# Patient Record
Sex: Female | Born: 1950 | Race: White | Hispanic: No | Marital: Married | Smoking: Former smoker
Health system: Southern US, Community
[De-identification: ages and names within clinical notes are randomized; demographics above are authoritative.]

## PROBLEM LIST (undated history)

## (undated) DIAGNOSIS — E785 Hyperlipidemia, unspecified: Secondary | ICD-10-CM

## (undated) DIAGNOSIS — F419 Anxiety disorder, unspecified: Secondary | ICD-10-CM

## (undated) DIAGNOSIS — I10 Essential (primary) hypertension: Secondary | ICD-10-CM

## (undated) DIAGNOSIS — R011 Cardiac murmur, unspecified: Secondary | ICD-10-CM

## (undated) DIAGNOSIS — K760 Fatty (change of) liver, not elsewhere classified: Secondary | ICD-10-CM

## (undated) HISTORY — DX: Fatty (change of) liver, not elsewhere classified: K76.0

## (undated) HISTORY — DX: Essential (primary) hypertension: I10

## (undated) HISTORY — DX: Anxiety disorder, unspecified: F41.9

## (undated) HISTORY — PX: SHOULDER ARTHROSCOPY: SHX128

## (undated) HISTORY — DX: Hyperlipidemia, unspecified: E78.5

## (undated) HISTORY — PX: ABDOMINAL HYSTERECTOMY: SHX81

## (undated) HISTORY — DX: Cardiac murmur, unspecified: R01.1

## (undated) HISTORY — PX: BACK SURGERY: SHX140

---

## 2017-02-27 LAB — HIV ANTIBODY (ROUTINE TESTING W REFLEX): HIV 1&2 Ab, 4th Generation: NONREACTIVE

## 2017-02-28 LAB — HM HIV SCREENING LAB: HM HIV Screening: NEGATIVE

## 2017-02-28 LAB — HM HEPATITIS C SCREENING LAB: HM Hepatitis Screen: NEGATIVE

## 2017-05-16 LAB — HM DEXA SCAN

## 2017-09-16 LAB — HM MAMMOGRAPHY

## 2018-02-02 LAB — HEMOGLOBIN A1C: Hemoglobin A1C: 5.3

## 2018-02-02 LAB — TSH: TSH: 1.56 (ref <=5.90)

## 2019-07-29 LAB — COMPREHENSIVE METABOLIC PANEL
Albumin: 4.2 (ref 3.5–5.0)
Calcium: 9.6 (ref 8.7–10.7)
GFR calc Af Amer: 52
GFR calc non Af Amer: 45
Globulin: 2.8

## 2019-07-29 LAB — CBC: RBC: 4.43 (ref 3.87–5.11)

## 2019-07-29 LAB — HEPATIC FUNCTION PANEL
ALT: 26 (ref 7–35)
AST: 27 (ref 13–35)
Alkaline Phosphatase: 82 (ref 25–125)
Bilirubin, Total: 0.5

## 2019-07-29 LAB — BASIC METABOLIC PANEL
BUN: 17 (ref 4–21)
CO2: 30 — AB (ref 13–22)
Chloride: 102 (ref 99–108)
Creatinine: 1.2 — AB (ref 0.5–1.1)
Glucose: 131
Potassium: 4.1 (ref 3.4–5.3)
Sodium: 141 (ref 137–147)

## 2019-07-29 LAB — CBC AND DIFFERENTIAL
HCT: 41 (ref 36–46)
Hemoglobin: 13.5 (ref 12.0–16.0)
Platelets: 297 (ref 150–399)
WBC: 5.1

## 2019-07-29 LAB — VITAMIN D 25 HYDROXY (VIT D DEFICIENCY, FRACTURES): Vit D, 25-Hydroxy: 42

## 2019-07-30 LAB — LIPID PANEL
Cholesterol: 144 (ref 0–200)
HDL: 40 (ref 35–70)
LDL Cholesterol: 79
Triglycerides: 82 (ref 40–160)

## 2019-12-25 ENCOUNTER — Telehealth: Payer: Self-pay

## 2019-12-25 NOTE — Telephone Encounter (Signed)
Copied from CRM [id number]. Topic: General - Other >> Dec 25, 2019  3:38 PM Marylen Ponto wrote: Reason for CRM: Pt has a new pt appt scheduled on 01/24/20 with Dr. B and pt requests Rx refill of pantoprazole 40 mg. If approved pt [facility] [place] Cb# [phone]

## 2019-12-26 NOTE — Telephone Encounter (Signed)
Pt called back in to follow up. Made pt aware of message per provider.

## 2019-12-26 NOTE — Telephone Encounter (Signed)
Cannot Rx medications until she establishes.  She can buy Nexium or Omeprazole OTC to take in place of protonix in the meantime.

## 2019-12-26 NOTE — Telephone Encounter (Signed)
NA, voicemail is full. 

## 2020-01-24 ENCOUNTER — Ambulatory Visit (INDEPENDENT_AMBULATORY_CARE_PROVIDER_SITE_OTHER): Payer: Medicare HMO | Admitting: Family Medicine

## 2020-01-24 ENCOUNTER — Other Ambulatory Visit: Payer: Self-pay

## 2020-01-24 ENCOUNTER — Encounter: Payer: Self-pay | Admitting: Family Medicine

## 2020-01-24 VITALS — BP 125/68 | HR 80 | Temp 97.9°F | Ht 62.0 in | Wt 203.0 lb

## 2020-01-24 DIAGNOSIS — I10 Essential (primary) hypertension: Secondary | ICD-10-CM | POA: Diagnosis not present

## 2020-01-24 DIAGNOSIS — K760 Fatty (change of) liver, not elsewhere classified: Secondary | ICD-10-CM

## 2020-01-24 DIAGNOSIS — K219 Gastro-esophageal reflux disease without esophagitis: Secondary | ICD-10-CM

## 2020-01-24 DIAGNOSIS — F411 Generalized anxiety disorder: Secondary | ICD-10-CM

## 2020-01-24 DIAGNOSIS — F419 Anxiety disorder, unspecified: Secondary | ICD-10-CM | POA: Insufficient documentation

## 2020-01-24 DIAGNOSIS — R8290 Unspecified abnormal findings in urine: Secondary | ICD-10-CM

## 2020-01-24 DIAGNOSIS — E782 Mixed hyperlipidemia: Secondary | ICD-10-CM | POA: Diagnosis not present

## 2020-01-24 DIAGNOSIS — E785 Hyperlipidemia, unspecified: Secondary | ICD-10-CM | POA: Insufficient documentation

## 2020-01-24 DIAGNOSIS — Z1159 Encounter for screening for other viral diseases: Secondary | ICD-10-CM

## 2020-01-24 LAB — POCT URINALYSIS DIPSTICK
Bilirubin, UA: NEGATIVE
Blood, UA: NEGATIVE
Glucose, UA: NEGATIVE
Ketones, UA: NEGATIVE
Leukocytes, UA: NEGATIVE
Nitrite, UA: NEGATIVE
Odor: POSITIVE
Protein, UA: NEGATIVE
Spec Grav, UA: 1.015 (ref 1.010–1.025)
Urobilinogen, UA: 0.2 E.U./dL
pH, UA: 6 (ref 5.0–8.0)

## 2020-01-24 MED ORDER — VENLAFAXINE HCL 37.5 MG PO TABS: 37.5000 mg | ORAL_TABLET | Freq: Two times a day (BID) | ORAL | 1 refills | Status: DC

## 2020-01-24 MED ORDER — HYDROCHLOROTHIAZIDE 12.5 MG PO CAPS: 12.5000 mg | ORAL_CAPSULE | Freq: Every day | ORAL | 1 refills | Status: DC

## 2020-01-24 MED ORDER — PANTOPRAZOLE SODIUM 40 MG PO TBEC: 40.0000 mg | DELAYED_RELEASE_TABLET | Freq: Every day | ORAL | 1 refills | Status: DC

## 2020-01-24 MED ORDER — AMLODIPINE BESYLATE 5 MG PO TABS: 5.0000 mg | ORAL_TABLET | Freq: Every day | ORAL | 1 refills | Status: DC

## 2020-01-24 NOTE — Assessment & Plan Note (Signed)
Patient reported previous diagnosis Discussed diet and exercise changes Weight loss would help Recheck LFTs

## 2020-01-24 NOTE — Assessment & Plan Note (Signed)
Previous control unclear We will request records from previous PCP Does not seem to be on a statin at this time, but will call back if she finds one at home Recheck CMP and FLP Calculate ASCVD risk to determine need for primary prevention with medication

## 2020-01-24 NOTE — Assessment & Plan Note (Signed)
Chronic and well-controlled Continue Effexor at current dose Encourage therapy No depressive symptoms or SI Follow-up in 6 months at CPE

## 2020-01-24 NOTE — Assessment & Plan Note (Signed)
Discussed importance of healthy weight management Discussed diet and exercise BMI 37 and associated with HTN, fatty liver disease, HLD, GERD Check labs today Referral to healthy weight and wellness clinic for medical management

## 2020-01-24 NOTE — Assessment & Plan Note (Signed)
Well controlled Continue current medications Recheck metabolic panel F/u in 6 months  

## 2020-01-24 NOTE — Assessment & Plan Note (Signed)
Chronic and well-controlled Continue PPI With dietary changes, consider discontinuing PPI

## 2020-01-24 NOTE — Progress Notes (Signed)
New patient visit   Patient: Candace Faulkner   DOB: [birth year]   69 y.o. Female  MRN: [id number] Visit Date: 01/24/2020  Today's healthcare provider: Shirlee Latch, MD   Chief Complaint  Patient presents with  . Establish Care   Subjective    Candace Faulkner is a 69 y.o. female who presents today as a new patient to establish care.  HPI  Patient has some concerns about trying to lose weight.  She reports having elevated cholesterol and fatty liver.  She states she is on medicine for the cholesterol but does not have it with her.  She has cut down on portion sizes and stopped drinking sodas. She tried keto diet and gained weight. No exercise currently, limited by OA pain. She has personal goal weight of 150lbs, the weight she was prior to birth of her last few children.  She just moved to the area from [place].    Previously diagnosed with anxiety. She is taking Effexor with good compliance and good control of symptoms.  Denies any depressive symptoms, SI, AVH.  Taking pantoprazole for GERD. Well controlled.  Insomnia - taking doxepin 25mg  qhs with good control of symptoms.   Past Medical History:  Diagnosis Date  . Anxiety   . Fatty liver   . Heart murmur   . Hyperlipidemia   . Hypertension    Past Surgical History:  Procedure Laterality Date  . ABDOMINAL HYSTERECTOMY    . BACK SURGERY     L4-L5 disc surgery  . SHOULDER ARTHROSCOPY Right    ligament repair   Family Status  Relation Name Status  . Brother  Deceased  . Neg Hx  (Not Specified)   Family History  Problem Relation Age of Onset  . Diabetes Brother   . Hypertension Neg Hx   . Breast cancer Neg Hx   . Colon cancer Neg Hx    Social History   Socioeconomic History  . Marital status: Married    Spouse name: Not on file  . Number of children: 7  . Years of education: 63  . Highest education level: Not on file  Occupational History  . Occupation: retired  Tobacco Use  . Smoking status: Former Smoker     Packs/day: 0.50    Years: 20.00    Pack years: 10.00    Types: Cigarettes    Quit date: 02/14/1989    Years since quitting: 30.9  . Smokeless tobacco: Never Used  Vaping Use  . Vaping Use: Every day  Substance and Sexual Activity  . Alcohol use: Not Currently  . Drug use: Never  . Sexual activity: Yes    Partners: Male    Birth control/protection: Post-menopausal, Surgical  Other Topics Concern  . Not on file  Social History Narrative  . Not on file   Social Determinants of Health   Financial Resource Strain: Not on file  Food Insecurity: Not on file  Transportation Needs: Not on file  Physical Activity: Not on file  Stress: Not on file  Social Connections: Not on file   Outpatient Medications Prior to Visit  Medication Sig  . [DISCONTINUED] amLODipine (NORVASC) 5 MG tablet Take 5 mg by mouth daily.  04/14/1989 doxepin (SINEQUAN) 25 MG capsule Take 25 mg by mouth.  . [DISCONTINUED] hydrochlorothiazide (MICROZIDE) 12.5 MG capsule Take 12.5 mg by mouth daily.  . [DISCONTINUED] pantoprazole (PROTONIX) 40 MG tablet Take 40 mg by mouth daily.  . [DISCONTINUED] venlafaxine (EFFEXOR) 37.5 MG tablet Take 37.5  mg by mouth 2 (two) times daily.   No facility-administered medications prior to visit.   Allergies  Allergen Reactions  . Latex     Immunization History  Administered Date(s) Administered  . Moderna SARS-COVID-2 Vaccination 09/13/2019, 10/11/2019    Health Maintenance  Topic Date Due  . Hepatitis C Screening  Never done  . TETANUS/TDAP  Never done  . MAMMOGRAM  Never done  . COLONOSCOPY  Never done  . DEXA SCAN  Never done  . PNA vac Low Risk Adult (1 of 2 - PCV13) Never done  . INFLUENZA VACCINE  05/14/2020 (Originally 09/15/2019)  . COVID-19 Vaccine (3 - Booster for Moderna series) 04/12/2020    Patient Care Team: Erasmo Downer, MD as PCP - General (Family Medicine)  Review of Systems  Constitutional: Negative.   HENT: Negative.   Eyes: Negative.    Respiratory: Negative.   Cardiovascular: Negative.   Gastrointestinal: Positive for abdominal distention.  Endocrine: Negative.   Genitourinary: Negative.   Musculoskeletal: Positive for back pain.  Skin: Negative.   Allergic/Immunologic: Negative.   Neurological: Negative.   Hematological: Negative.   Psychiatric/Behavioral: Negative.       Objective    BP 125/68 (BP Location: Right Arm, Patient Position: Sitting, Cuff Size: Normal)   Pulse 80   Temp 97.9 F (36.6 C) (Oral)   Ht 5\' 2"  (1.575 m)   Wt 203 lb (92.1 kg)   SpO2 98%   BMI 37.13 kg/m  Physical Exam Vitals reviewed.  Constitutional:      General: She is not in acute distress.    Appearance: Normal appearance. She is well-developed. She is not diaphoretic.  HENT:     Head: Normocephalic and atraumatic.  Eyes:     General: No scleral icterus.    Conjunctiva/sclera: Conjunctivae normal.  Neck:     Thyroid: No thyromegaly.  Cardiovascular:     Rate and Rhythm: Normal rate and regular rhythm.     Pulses: Normal pulses.     Heart sounds: Normal heart sounds. No murmur heard.   Pulmonary:     Effort: Pulmonary effort is normal. No respiratory distress.     Breath sounds: Normal breath sounds. No wheezing, rhonchi or rales.  Musculoskeletal:     Cervical back: Neck supple.     Right lower leg: No edema.     Left lower leg: No edema.  Lymphadenopathy:     Cervical: No cervical adenopathy.  Skin:    General: Skin is warm and dry.     Findings: No rash.  Neurological:     Mental Status: She is alert and oriented to person, place, and time. Mental status is at baseline.  Psychiatric:        Mood and Affect: Mood normal.        Behavior: Behavior normal.      Depression Screen PHQ 2/9 Scores 01/24/2020  PHQ - 2 Score 0  PHQ- 9 Score 3   Results for orders placed or performed in visit on 01/24/20  POCT urinalysis dipstick  Result Value Ref Range   Color, UA yellow    Clarity, UA clear     Glucose, UA Negative Negative   Bilirubin, UA neg    Ketones, UA neg    Spec Grav, UA 1.015 1.010 - 1.025   Blood, UA neg    pH, UA 6.0 5.0 - 8.0   Protein, UA Negative Negative   Urobilinogen, UA 0.2 0.2 or 1.0 E.U./dL   Nitrite,  UA neg    Leukocytes, UA Negative Negative   Appearance     Odor pos     Assessment & Plan      Problem List Items Addressed This Visit      Cardiovascular and Mediastinum   Hypertension - Primary    Well controlled Continue current medications Recheck metabolic panel F/u in 6 months       Relevant Medications   hydrochlorothiazide (MICROZIDE) 12.5 MG capsule   amLODipine (NORVASC) 5 MG tablet   Other Relevant Orders   Comprehensive metabolic panel     Digestive   Fatty liver    Patient reported previous diagnosis Discussed diet and exercise changes Weight loss would help Recheck LFTs      Relevant Orders   Comprehensive metabolic panel   Gastroesophageal reflux disease without esophagitis    Chronic and well-controlled Continue PPI With dietary changes, consider discontinuing PPI      Relevant Medications   pantoprazole (PROTONIX) 40 MG tablet   Other Relevant Orders   CBC     Other   Anxiety    Chronic and well-controlled Continue Effexor at current dose Encourage therapy No depressive symptoms or SI Follow-up in 6 months at CPE      Relevant Medications   doxepin (SINEQUAN) 25 MG capsule   venlafaxine (EFFEXOR) 37.5 MG tablet   Hyperlipidemia    Previous control unclear We will request records from previous PCP Does not seem to be on a statin at this time, but will call back if she finds one at home Recheck CMP and FLP Calculate ASCVD risk to determine need for primary prevention with medication      Relevant Medications   hydrochlorothiazide (MICROZIDE) 12.5 MG capsule   amLODipine (NORVASC) 5 MG tablet   Other Relevant Orders   Lipid panel   Comprehensive metabolic panel   Morbid obesity (HCC)     Discussed importance of healthy weight management Discussed diet and exercise BMI 37 and associated with HTN, fatty liver disease, HLD, GERD Check labs today Referral to healthy weight and wellness clinic for medical management      Relevant Orders   Lipid panel   Comprehensive metabolic panel   TSH   Amb Ref to Medical Weight Management   CBC    Other Visit Diagnoses    Need for hepatitis C screening test       Relevant Orders   Hepatitis C Antibody   Foul smelling urine       Relevant Orders   POCT urinalysis dipstick (Completed)      ROI sent to previous PCP office for her last health maintenance, labs, etc.   Return in about 6 months (around 07/24/2020) for CPE, AWV.      Total time spent on today's visit was greater than 45 minutes, including both face-to-face time and nonface-to-face time personally spent on review of chart (labs and imaging), discussing labs and goals, discussing further work-up, treatment options, referrals to specialist if needed, reviewing outside records of pertinent, answering patient's questions, and coordinating care.    I, Shirlee Latch, MD, have reviewed all documentation for this visit. The documentation on 01/24/20 for the exam, diagnosis, procedures, and orders are all accurate and complete.   Candace Faulkner, Candace Schlein, MD, MPH [facility]

## 2020-02-20 ENCOUNTER — Telehealth: Payer: Self-pay | Admitting: Family Medicine

## 2020-02-20 NOTE — Telephone Encounter (Signed)
Patient need PA on Doxepin.

## 2020-02-20 NOTE — Telephone Encounter (Signed)
Medication Refill - Medication: Doxepin   Has the patient contacted their pharmacy? Yes.   Pt states that the pharmacy told her they needed authorization from PCP. Please advise.  (Agent: If no, request that the patient contact the pharmacy for the refill.) (Agent: If yes, when and what did the pharmacy advise?)  Preferred Pharmacy (with phone number or street name):  [id number] [place], [place] - [address]  [address] [place] [address]  Phone: [phone] Fax: [phone]  Hours: Not open 24 hours     Agent: Please be advised that RX refills may take up to 3 business days. We ask that you follow-up with your pharmacy.

## 2020-02-25 ENCOUNTER — Other Ambulatory Visit: Payer: Self-pay

## 2020-02-25 MED ORDER — DOXEPIN HCL 25 MG PO CAPS: 25.0000 mg | ORAL_CAPSULE | Freq: Every day | ORAL | 3 refills | Status: DC

## 2020-02-25 NOTE — Telephone Encounter (Signed)
PA is not needed for this medication, patient is needing refill on medication.

## 2020-03-24 ENCOUNTER — Telehealth: Payer: Self-pay | Admitting: Family Medicine

## 2020-03-24 NOTE — Telephone Encounter (Signed)
Copied from CRM [id number]. Topic: Medicare AWV >> Mar 24, 2020 12:22 PM Claudette Laws R wrote: Reason for CRM:  Left message for patient to call back and schedule Medicare Annual Wellness Visit (AWV) in office.   If not able to come in the office, please offer to do virtually or by telephone.   No hx of AWV - AWV-I eligible as of 01/14/2018  Please schedule at anytime with [facility] Health Advisor.   40 minute appointment  Any questions, please contact me at [phone]

## 2020-03-31 ENCOUNTER — Ambulatory Visit: Payer: Self-pay | Admitting: *Deleted

## 2020-03-31 NOTE — Telephone Encounter (Signed)
Pt calling stating that the she is needing to have a medication refilled. She states that it is a BP medication and she is not sure of the name, but that it starts with an "A". Pt states that she has been out x 2-3 days. Please advise.   Call to patient - patient is requesting RF of atorvastatin 10 mg- she states she has been taking for 2 years. Patient advised will send request for PCP review and office wil call her back with any questions. Reason for Disposition . [1] Prescription refill request for NON-ESSENTIAL medicine (i.e., no harm to patient if med not taken) AND [2] triager unable to refill per department policy  Answer Assessment - Initial Assessment Questions 1. DRUG NAME: "What medicine do you need to have refilled?"     atorvastatin 10 mg- patient reports she has been taking this medication for high cholesterol for 2 years now. 2. REFILLS REMAINING: "How many refills are remaining?" (Note: The label on the medicine or pill bottle will show how many refills are remaining. If there are no refills remaining, then a renewal may be needed.)     none 3. EXPIRATION DATE: "What is the expiration date?" (Note: The label states when the prescription will expire, and thus can no longer be refilled.)     Out of medication- plans labs today 4. PRESCRIBING HCP: "Who prescribed it?" Reason: If prescribed by specialist, call should be referred to that group.    Historical provider 5. SYMPTOMS: "Do you have any symptoms?"     no 6. PREGNANCY: "Is there any chance that you are pregnant?" "When was your last menstrual period?"     n/a  Protocols used: MEDICATION REFILL AND RENEWAL CALL-A-AH

## 2020-04-01 NOTE — Telephone Encounter (Signed)
LMTCB

## 2020-04-01 NOTE — Telephone Encounter (Signed)
Per triage note, patient originally requested refill on a BP medication.  RN has listed medication as Atorvastatin.  Patient is not on Atorvastatin and it is not in her medication history.    Need to clarify if patient needs Atorvastatin or if she thinks she needs refill on a blood pressure medication (which may be her Amlodipine-since she said it began with an A)

## 2020-05-26 ENCOUNTER — Ambulatory Visit: Payer: Self-pay | Admitting: *Deleted

## 2020-05-26 DIAGNOSIS — L309 Dermatitis, unspecified: Secondary | ICD-10-CM | POA: Diagnosis not present

## 2020-05-26 NOTE — Telephone Encounter (Signed)
Pt called stating her hands are swollen, red, and itchy;  the pt says her hands are has a throbbing and have a burning sensation; the itching and redness started on 05/25/20 and have worsened; she noticed the swelling on 05/25/20; there is a ring on her left ring from that she can not remove; recommendations made per nurse triage; she verbalized understanding and will be evaluated at Urgent Care; the pt is seen by Dr Beryle Flock, [facility]; will route to office for notification.   Reason for Disposition . SEVERE hand swelling (e.g., swelling of entire hand and up into forearm)  Answer Assessment - Initial Assessment Questions 1. ONSET: "When did the swelling start?" (e.g., minutes, hours, days)     05/24/20 2. LOCATION: "What part of the hand is swollen?"  "Are both hands swollen or just one hand?"     Tops ob both hands and all finers 3. SEVERITY: "How bad is the swelling?" (e.g., localized; mild, moderate, severe)   - BALL OR LUMP: small ball or lump   - LOCALIZED: puffy or swollen area or patch of skin   - JOINT SWELLING: swelling of a joint   - MILD: puffiness or mild swelling of fingers or hand   - MODERATE: fingers and hand are swollen   - SEVERE: swelling of entire hand and up into forearm    moderate 4. REDNESS: "Does the swelling look red or infected?"     reddened 5. PAIN: "Is the swelling painful to touch?" If Yes, ask: "How painful is it?"   (Scale 1-10; mild, moderate or severe)    Throbbing pain 6. FEVER: "Do you have a fever?" If Yes, ask: "What is it, how was it measured, and when did it start?"      7. CAUSE: "What do you think is causing the hand swelling?" (e.g., heat, insect bite, pregnancy, recent injury) unsure 8. MEDICAL HISTORY: "Do you have a history of heart failure, kidney disease, liver failure, or cancer?"      9. RECURRENT SYMPTOM: "Have you had hand swelling before?" If Yes, ask: "When was the last time?" "What happened that time?"    10. OTHER  SYMPTOMS: "Do you have any other symptoms?" (e.g., blurred vision, difficulty breathing, headache)     Hands itching; unable to remove ring from left ring finger 11. PREGNANCY: "Is there any chance you are pregnant?" "When was your last menstrual period?"  Protocols used: HAND [facility]

## 2020-06-28 ENCOUNTER — Other Ambulatory Visit: Payer: Self-pay | Admitting: Family Medicine

## 2020-06-28 NOTE — Telephone Encounter (Signed)
Requested Prescriptions  Pending Prescriptions Disp Refills  . doxepin (SINEQUAN) 25 MG capsule [Pharmacy Med Name: DOXEPIN 25 MG CAPSULE] 30 capsule 0    Sig: TAKE 1 CAPSULE BY MOUTH AT BEDTIME.     Psychiatry:  Antidepressants - Heterocyclics (TCAs) Passed - 06/28/2020  9:08 AM      Passed - Valid encounter within last 6 months    Recent Outpatient Visits          5 months ago Primary hypertension   [facility] Bacigalupo, Marzella Schlein, MD      Future Appointments            In 4 weeks Bacigalupo, Marzella Schlein, MD [facility], [facility]

## 2020-07-20 ENCOUNTER — Other Ambulatory Visit: Payer: Self-pay | Admitting: Family Medicine

## 2020-07-27 ENCOUNTER — Encounter: Payer: Self-pay | Admitting: Family Medicine

## 2020-07-28 ENCOUNTER — Other Ambulatory Visit: Payer: Self-pay | Admitting: Family Medicine

## 2020-07-28 NOTE — Telephone Encounter (Signed)
Requested medications are due for refill today yes  Requested medications are on the active medication list yes  Last refill 5/15  Last visit Was scheduled 6/13 but canceled by provider  Future visit scheduled 9/27  Notes to clinic Was already given a 30 day supply, provider canceled appt, next appt 11/10/20.

## 2020-07-30 ENCOUNTER — Other Ambulatory Visit: Payer: Self-pay | Admitting: Family Medicine

## 2020-07-30 NOTE — Telephone Encounter (Signed)
Requested Prescriptions  Pending Prescriptions Disp Refills  . pantoprazole (PROTONIX) 40 MG tablet [Pharmacy Med Name: PANTOPRAZOLE SOD DR 40 MG TAB] 90 tablet 1    Sig: TAKE 1 TABLET BY MOUTH EVERY DAY     Gastroenterology: Proton Pump Inhibitors Passed - 07/30/2020  1:32 AM      Passed - Valid encounter within last 12 months    Recent Outpatient Visits          6 months ago Primary hypertension   [facility] Bacigalupo, Marzella Schlein, MD      Future Appointments            In 3 months Bacigalupo, Marzella Schlein, MD [facility], [facility]   In 7 months Bacigalupo, Marzella Schlein, MD [facility], [facility]

## 2020-08-10 ENCOUNTER — Other Ambulatory Visit: Payer: Self-pay | Admitting: Family Medicine

## 2020-08-11 NOTE — Telephone Encounter (Signed)
Notes to clinic:  Next appt is scheduled for 11/10/2020 Review for enough medication until that time Appt on 6/16 was canceled by provider    Requested Prescriptions  Pending Prescriptions Disp Refills   amLODipine (NORVASC) 5 MG tablet 90 tablet 1    Sig: Take 1 tablet (5 mg total) by mouth daily.      Cardiovascular:  Calcium Channel Blockers Failed - 08/11/2020  3:09 PM      Failed - Valid encounter within last 6 months    Recent Outpatient Visits           6 months ago Primary hypertension   [facility] Bacigalupo, Marzella Schlein, MD       Future Appointments             In 3 months Bacigalupo, Marzella Schlein, MD [facility], [facility]   In 6 months Bacigalupo, Marzella Schlein, MD [facility], [facility]             Passed - Last BP in normal range    BP Readings from Last 1 Encounters:  01/24/20 125/68           Refused Prescriptions Disp Refills   hydrochlorothiazide (MICROZIDE) 12.5 MG capsule [Pharmacy Med Name: HYDROCHLOROTHIAZIDE 12.5 MG CP] 90 capsule 0    Sig: TAKE 1 CAPSULE BY MOUTH EVERY DAY      Cardiovascular: Diuretics - Thiazide Failed - 08/11/2020  3:09 PM      Failed - Ca in normal range and within 360 days    Calcium  Date Value Ref Range Status  07/29/2019 9.6 8.7 - 10.7 Final          Failed - Cr in normal range and within 360 days    Creatinine  Date Value Ref Range Status  07/29/2019 1.2 (A) 0.5 - 1.1 Final          Failed - K in normal range and within 360 days    Potassium  Date Value Ref Range Status  07/29/2019 4.1 3.4 - 5.3 Final          Failed - Na in normal range and within 360 days    Sodium  Date Value Ref Range Status  07/29/2019 141 137 - 147 Final          Failed - Valid encounter within last 6 months    Recent Outpatient Visits           6 months ago Primary hypertension   [facility] Bacigalupo, Marzella Schlein, MD       Future Appointments             In 3 months  Bacigalupo, Marzella Schlein, MD [facility], [facility]   In 6 months Bacigalupo, Marzella Schlein, MD [facility], [facility]             Passed - Last BP in normal range    BP Readings from Last 1 Encounters:  01/24/20 125/68            venlafaxine (EFFEXOR) 37.5 MG tablet [Pharmacy Med Name: VENLAFAXINE HCL 37.5 MG TABLET] 180 tablet 0    Sig: TAKE 1 TABLET BY MOUTH TWICE A DAY      Psychiatry: Antidepressants - SNRI - desvenlafaxine & venlafaxine Failed - 08/11/2020  3:09 PM      Failed - LDL in normal range and within 360 days    LDL Cholesterol  Date Value Ref Range Status  07/29/2019 79  Final          Failed - Total Cholesterol in normal range and within 360 days    Cholesterol  Date Value Ref Range Status  07/29/2019 144 0 - 200 Final          Failed - Triglycerides in normal range and within 360 days    Triglycerides  Date Value Ref Range Status  07/29/2019 82 40 - 160 Final          Failed - Valid encounter within last 6 months    Recent Outpatient Visits           6 months ago Primary hypertension   [facility] Bacigalupo, Marzella Schlein, MD       Future Appointments             In 3 months Bacigalupo, Marzella Schlein, MD [facility], [facility]   In 6 months Bacigalupo, Marzella Schlein, MD [facility], [facility]             Passed - Last BP in normal range    BP Readings from Last 1 Encounters:  01/24/20 125/68

## 2020-08-11 NOTE — Telephone Encounter (Signed)
Pt called and stated that the pharmacy advised her all her medications were due for refill/ Pt was advised and understood that request was too early but she stated the only medication she asked the pharmacy to send request for was amLODipine (NORVASC) 5 MG tablet [facility] send to [id number] - [place], [place] - [address]  [address], [place] [address]  Phone:  [phone]  Fax:  [phone]

## 2020-08-12 MED ORDER — AMLODIPINE BESYLATE 5 MG PO TABS: 5.0000 mg | ORAL_TABLET | Freq: Every day | ORAL | 0 refills | Status: DC

## 2020-09-14 ENCOUNTER — Other Ambulatory Visit: Payer: Self-pay | Admitting: Family Medicine

## 2020-09-14 NOTE — Telephone Encounter (Signed)
Requested Prescriptions  Pending Prescriptions Disp Refills  . hydrochlorothiazide (MICROZIDE) 12.5 MG capsule [Pharmacy Med Name: HYDROCHLOROTHIAZIDE 12.5 MG CP] 90 capsule 0    Sig: TAKE 1 CAPSULE BY MOUTH EVERY DAY     Cardiovascular: Diuretics - Thiazide Failed - 09/14/2020  3:24 PM      Failed - Ca in normal range and within 360 days    Calcium  Date Value Ref Range Status  07/29/2019 9.6 8.7 - 10.7 Final         Failed - Cr in normal range and within 360 days    Creatinine  Date Value Ref Range Status  07/29/2019 1.2 (A) 0.5 - 1.1 Final         Failed - K in normal range and within 360 days    Potassium  Date Value Ref Range Status  07/29/2019 4.1 3.4 - 5.3 Final         Failed - Na in normal range and within 360 days    Sodium  Date Value Ref Range Status  07/29/2019 141 137 - 147 Final         Failed - Valid encounter within last 6 months    Recent Outpatient Visits          7 months ago Primary hypertension   [facility] Bacigalupo, Marzella Schlein, MD      Future Appointments            In 3 months Bacigalupo, Marzella Schlein, MD [facility]   In 5 months Bacigalupo, Marzella Schlein, MD [facility], [facility]           Passed - Last BP in normal range    BP Readings from Last 1 Encounters:  01/24/20 125/68         . venlafaxine (EFFEXOR) 37.5 MG tablet [Pharmacy Med Name: VENLAFAXINE HCL 37.5 MG TABLET] 180 tablet 0    Sig: TAKE 1 TABLET BY MOUTH TWICE A DAY     Psychiatry: Antidepressants - SNRI - desvenlafaxine & venlafaxine Failed - 09/14/2020  3:24 PM      Failed - LDL in normal range and within 360 days    LDL Cholesterol  Date Value Ref Range Status  07/29/2019 79  Final         Failed - Total Cholesterol in normal range and within 360 days    Cholesterol  Date Value Ref Range Status  07/29/2019 144 0 - 200 Final         Failed - Triglycerides in normal range and within 360 days    Triglycerides  Date Value Ref  Range Status  07/29/2019 82 40 - 160 Final         Failed - Valid encounter within last 6 months    Recent Outpatient Visits          7 months ago Primary hypertension    [facility] Bacigalupo, Marzella Schlein, MD      Future Appointments            In 3 months Bacigalupo, Marzella Schlein, MD [facility], [facility]   In 5 months Bacigalupo, Marzella Schlein, MD [facility], [facility]           Passed - Last BP in normal range    BP Readings from Last 1 Encounters:  01/24/20 125/68

## 2020-09-14 NOTE — Telephone Encounter (Signed)
Requested Prescriptions  Pending Prescriptions Disp Refills  . doxepin (SINEQUAN) 25 MG capsule [Pharmacy Med Name: DOXEPIN 25 MG CAPSULE] 30 capsule 0    Sig: TAKE 1 CAPSULE BY MOUTH EVERYDAY AT BEDTIME     Psychiatry:  Antidepressants - Heterocyclics (TCAs) Failed - 09/14/2020  3:24 PM      Failed - Valid encounter within last 6 months    Recent Outpatient Visits          7 months ago Primary hypertension    [facility] Bacigalupo, Marzella Schlein, MD      Future Appointments            In 3 months Bacigalupo, Marzella Schlein, MD [facility], [facility]   In 5 months Bacigalupo, Marzella Schlein, MD [facility], [facility]

## 2020-09-28 ENCOUNTER — Ambulatory Visit (INDEPENDENT_AMBULATORY_CARE_PROVIDER_SITE_OTHER): Payer: Self-pay | Admitting: Family Medicine

## 2020-09-28 ENCOUNTER — Other Ambulatory Visit: Payer: Self-pay

## 2020-09-28 ENCOUNTER — Encounter: Payer: Self-pay | Admitting: Family Medicine

## 2020-09-28 DIAGNOSIS — I10 Essential (primary) hypertension: Secondary | ICD-10-CM

## 2020-09-28 DIAGNOSIS — R600 Localized edema: Secondary | ICD-10-CM | POA: Insufficient documentation

## 2020-09-28 MED ORDER — HYDROCHLOROTHIAZIDE 25 MG PO TABS: 25.0000 mg | ORAL_TABLET | Freq: Every day | ORAL | 3 refills | Status: DC

## 2020-09-28 NOTE — Assessment & Plan Note (Signed)
Well controlled on home readings Will stop amlodipine as below for edema and increase HCTZ to compensate F/u in 1 m
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
## 2020-09-28 NOTE — Assessment & Plan Note (Signed)
Discussed importance of healthy weight management Discussed diet and exercise  

## 2020-09-28 NOTE — Assessment & Plan Note (Signed)
New problem x54m Stable without signs of DVT Suspect chronic venous stasis related to morbid obesity vs amlodipine side effects Stop amlodipine Increase HCTZ to 25mg  daily Decrease sodium in diet Return precautions discussed

## 2020-09-30 DIAGNOSIS — L309 Dermatitis, unspecified: Secondary | ICD-10-CM | POA: Diagnosis not present

## 2020-10-07 ENCOUNTER — Telehealth: Payer: Self-pay

## 2020-10-07 NOTE — Telephone Encounter (Signed)
Copied from CRM [id number]. Topic: Appointment Scheduling - Scheduling Inquiry for Clinic >> Oct 07, 2020  2:03 PM Glean Salen wrote: Reason for WER:[id number] called says dr Beryle Flock wanted to see her for 1 month fu, but nothing available until Oct. Please call back

## 2020-10-07 NOTE — Telephone Encounter (Signed)
Please review.  Is there a place you can fit Ms. Trawick in?  Thanks,   -Vernona Rieger

## 2020-10-07 NOTE — Telephone Encounter (Signed)
You can see if there are visits labelled ok for 20 or ok to double book available

## 2020-10-08 NOTE — Telephone Encounter (Signed)
Patient's voicemail is full and will not accept messages

## 2020-10-09 NOTE — Telephone Encounter (Signed)
Tried to contact patient again today.  Went to [employer] and it will not accept messages.

## 2020-10-14 ENCOUNTER — Other Ambulatory Visit: Payer: Self-pay | Admitting: Family Medicine

## 2020-10-14 NOTE — Telephone Encounter (Signed)
Requested medications are due for refill today.  Unknown  Requested medications are on the active medications list.  no  Last refill. unknown  Future visit scheduled.   yes  Notes to clinic.  Medication not on med list. Per note of 09/14/2020 patient is no longer taking this medication.

## 2020-10-15 NOTE — Telephone Encounter (Signed)
Can we double check with patient that she is still taking this (see note from [facility] RN)

## 2020-10-16 NOTE — Telephone Encounter (Signed)
NA, mailbox is full. 

## 2020-10-29 ENCOUNTER — Ambulatory Visit: Payer: Self-pay | Admitting: Family Medicine

## 2020-11-10 ENCOUNTER — Encounter: Payer: Self-pay | Admitting: Family Medicine

## 2020-12-03 ENCOUNTER — Other Ambulatory Visit: Payer: Self-pay

## 2020-12-03 ENCOUNTER — Ambulatory Visit (INDEPENDENT_AMBULATORY_CARE_PROVIDER_SITE_OTHER): Payer: Medicare HMO | Admitting: Family Medicine

## 2020-12-03 ENCOUNTER — Encounter: Payer: Self-pay | Admitting: Family Medicine

## 2020-12-03 VITALS — BP 132/82 | HR 79 | Temp 97.8°F | Resp 16 | Wt 213.8 lb

## 2020-12-03 DIAGNOSIS — R1030 Lower abdominal pain, unspecified: Secondary | ICD-10-CM | POA: Diagnosis not present

## 2020-12-03 DIAGNOSIS — I10 Essential (primary) hypertension: Secondary | ICD-10-CM

## 2020-12-03 DIAGNOSIS — R600 Localized edema: Secondary | ICD-10-CM | POA: Diagnosis not present

## 2020-12-03 DIAGNOSIS — R140 Abdominal distension (gaseous): Secondary | ICD-10-CM | POA: Diagnosis not present

## 2020-12-03 MED ORDER — DICYCLOMINE HCL 10 MG PO CAPS: 10.0000 mg | ORAL_CAPSULE | Freq: Three times a day (TID) | ORAL | 1 refills | Status: DC

## 2020-12-03 NOTE — Progress Notes (Signed)
Established patient visit   Patient: Candace Faulkner   DOB: 1950/07/21   70 y.o. Female  MRN: [id number] Visit Date: 12/03/2020  Today's healthcare provider: Lavon Paganini, MD   Chief Complaint  Patient presents with   Follow-up   Nausea   Subjective    Abdominal Pain This is a new (nausea) problem. The current episode started yesterday. The onset quality is sudden. Episode frequency: in the mornings. The problem has been unchanged. The pain is mild. Associated symptoms include nausea. Pertinent negatives include no constipation, diarrhea or vomiting. Exacerbated by: any smell.   Lower abd, under navel x2d Feels like a cramp, intermittently Nausea just this AM No urinary symptoms Daily soft BMS, no diarrhea, no bllod in stool, no fevers   Follow up for edema  The patient was last seen for this 1 months ago. Changes made at last visit include stop amolodipine and increase HCTZ. Patient advised to decrease sodium in diet.  She reports excellent compliance with treatment. She feels that condition is Unchanged. She is not having side effects.  Patient reports she does not check her BP at home.  BP Readings from Last 3 Encounters:  12/03/20 132/82  01/24/20 125/68   -----------------------------------------------------------------------------------------     Medications: Outpatient Medications Prior to Visit  Medication Sig   hydrochlorothiazide (HYDRODIURIL) 25 MG tablet Take 1 tablet (25 mg total) by mouth daily.   pantoprazole (PROTONIX) 40 MG tablet TAKE 1 TABLET BY MOUTH EVERY DAY   venlafaxine (EFFEXOR) 37.5 MG tablet TAKE 1 TABLET BY MOUTH TWICE A DAY   No facility-administered medications prior to visit.    Review of Systems  Constitutional:  Negative for appetite change.  Respiratory:  Negative for chest tightness and shortness of breath.   Cardiovascular:  Positive for leg swelling. Negative for chest pain and palpitations.  Gastrointestinal:   Positive for abdominal pain and nausea. Negative for blood in stool, constipation, diarrhea and vomiting.   Last CBC Lab Results  Component Value Date   WBC 7.1 12/03/2020   HGB 15.1 12/03/2020   HCT 44.6 12/03/2020   MCV 92 12/03/2020   MCH 31.1 12/03/2020   RDW 11.9 12/03/2020   PLT 275 96/29/5284   Last metabolic panel Lab Results  Component Value Date   GLUCOSE 104 (H) 12/03/2020   NA 144 12/03/2020   K 4.0 12/03/2020   CL 101 12/03/2020   CO2 28 12/03/2020   BUN 15 12/03/2020   CREATININE 1.17 (H) 12/03/2020   EGFR 51 (L) 12/03/2020   GFRNONAA 45 07/29/2019   CALCIUM 9.9 12/03/2020   PROT 7.4 12/03/2020   ALBUMIN 4.8 12/03/2020   LABGLOB 2.6 12/03/2020   AGRATIO 1.8 12/03/2020   BILITOT 0.5 12/03/2020   ALKPHOS 76 12/03/2020   AST 40 12/03/2020   ALT 45 (H) 12/03/2020       Objective    BP 132/82 (BP Location: Left Arm, Patient Position: Sitting, Cuff Size: Large)   Pulse 79   Temp 97.8 F (36.6 C) (Temporal)   Resp 16   Wt 213 lb 12.8 oz (97 kg)   SpO2 95%   BMI 39.10 kg/m  BP Readings from Last 3 Encounters:  12/03/20 132/82  01/24/20 125/68   Wt Readings from Last 3 Encounters:  12/03/20 213 lb 12.8 oz (97 kg)  01/24/20 203 lb (92.1 kg)      Physical Exam    Results for orders placed or performed in visit on 12/03/20  Comprehensive metabolic  panel  Result Value Ref Range   Glucose 104 (H) 70 - 99 mg/dL   BUN 15 8 - 27 mg/dL   Creatinine, Ser 1.17 (H) 0.57 - 1.00 mg/dL   eGFR 51 (L) >59 mL/min/1.73   BUN/Creatinine Ratio 13 12 - 28   Sodium 144 134 - 144 mmol/L   Potassium 4.0 3.5 - 5.2 mmol/L   Chloride 101 96 - 106 mmol/L   CO2 28 20 - 29 mmol/L   Calcium 9.9 8.7 - 10.3 mg/dL   Total Protein 7.4 6.0 - 8.5 g/dL   Albumin 4.8 3.8 - 4.8 g/dL   Globulin, Total 2.6 1.5 - 4.5 g/dL   Albumin/Globulin Ratio 1.8 1.2 - 2.2   Bilirubin Total 0.5 0.0 - 1.2 mg/dL   Alkaline Phosphatase 76 44 - 121 IU/L   AST 40 0 - 40 IU/L   ALT 45 (H) 0 -  32 IU/L  CBC  Result Value Ref Range   WBC 7.1 3.4 - 10.8 x10E3/uL   RBC 4.85 3.77 - 5.28 x10E6/uL   Hemoglobin 15.1 11.1 - 15.9 g/dL   Hematocrit 44.6 34.0 - 46.6 %   MCV 92 79 - 97 fL   MCH 31.1 26.6 - 33.0 pg   MCHC 33.9 31.5 - 35.7 g/dL   RDW 11.9 11.7 - 15.4 %   Platelets 275 150 - 450 x10E3/uL    Assessment & Plan     Problem List Items Addressed This Visit       Cardiovascular and Mediastinum   Hypertension - Primary    Well controlled Continue current medications Recheck metabolic panel      Relevant Orders   Comprehensive metabolic panel (Completed)     Other   Morbid obesity (HCC)    BMI 39 and associated with HTN, HLD Discussed importance of healthy weight management Discussed diet and exercise       Relevant Orders   Amb Ref to Medical Weight Management   Lower extremity edema    Better since stopping amlodipine and using L knee brace      Abdominal distension    See plan as above for abdominal pain      Relevant Orders   US Abdomen Complete   Comprehensive metabolic panel (Completed)   CBC (Completed)   Lower abdominal pain    New problem Crampy in nature No urinary symptoms She does have fairly significant new abdominal distention on exam Trial of Gas-X and Bentyl as needed Check CMP today Abdominal ultrasound to evaluate for any ascites or free fluid Return precautions discussed      Relevant Orders   US Abdomen Complete   Comprehensive metabolic panel (Completed)   CBC (Completed)     Return in about 3 months (around 03/05/2021) for CPE, as scheduled.      I, Lavon Paganini, MD, have reviewed all documentation for this visit. The documentation on 12/04/20 for the exam, diagnosis, procedures, and orders are all accurate and complete.   Treanna Dumler, Dionne Bucy, MD, MPH [facility]

## 2020-12-03 NOTE — Assessment & Plan Note (Signed)
Better since stopping amlodipine and using L knee brace

## 2020-12-03 NOTE — Assessment & Plan Note (Signed)
Well controlled Continue current medications Recheck metabolic panel 

## 2020-12-04 ENCOUNTER — Ambulatory Visit: Payer: Self-pay

## 2020-12-04 DIAGNOSIS — R1030 Lower abdominal pain, unspecified: Secondary | ICD-10-CM | POA: Insufficient documentation

## 2020-12-04 DIAGNOSIS — R140 Abdominal distension (gaseous): Secondary | ICD-10-CM | POA: Insufficient documentation

## 2020-12-04 LAB — CBC
Hematocrit: 44.6 % (ref 34.0–46.6)
Hemoglobin: 15.1 g/dL (ref 11.1–15.9)
MCH: 31.1 pg (ref 26.6–33.0)
MCHC: 33.9 g/dL (ref 31.5–35.7)
MCV: 92 fL (ref 79–97)
Platelets: 275 10*3/uL (ref 150–450)
RBC: 4.85 x10E6/uL (ref 3.77–5.28)
RDW: 11.9 % (ref 11.7–15.4)
WBC: 7.1 10*3/uL (ref 3.4–10.8)

## 2020-12-04 LAB — COMPREHENSIVE METABOLIC PANEL
ALT: 45 IU/L — ABNORMAL HIGH (ref 0–32)
AST: 40 IU/L (ref 0–40)
Albumin/Globulin Ratio: 1.8 (ref 1.2–2.2)
Albumin: 4.8 g/dL (ref 3.8–4.8)
Alkaline Phosphatase: 76 IU/L (ref 44–121)
BUN/Creatinine Ratio: 13 (ref 12–28)
BUN: 15 mg/dL (ref 8–27)
Bilirubin Total: 0.5 mg/dL (ref 0.0–1.2)
CO2: 28 mmol/L (ref 20–29)
Calcium: 9.9 mg/dL (ref 8.7–10.3)
Chloride: 101 mmol/L (ref 96–106)
Creatinine, Ser: 1.17 mg/dL — ABNORMAL HIGH (ref 0.57–1.00)
Globulin, Total: 2.6 g/dL (ref 1.5–4.5)
Glucose: 104 mg/dL — ABNORMAL HIGH (ref 70–99)
Potassium: 4 mmol/L (ref 3.5–5.2)
Sodium: 144 mmol/L (ref 134–144)
Total Protein: 7.4 g/dL (ref 6.0–8.5)
eGFR: 51 mL/min/{1.73_m2} — ABNORMAL LOW (ref >59)

## 2020-12-04 NOTE — Assessment & Plan Note (Signed)
New problem Crampy in nature No urinary symptoms She does have fairly significant new abdominal distention on exam Trial of Gas-X and Bentyl as needed Check CMP today Abdominal ultrasound to evaluate for any ascites or free fluid Return precautions discussed

## 2020-12-04 NOTE — Assessment & Plan Note (Signed)
See plan as above for abdominal pain

## 2020-12-04 NOTE — Telephone Encounter (Signed)
Pt called in saying that she is having abdominal pain. She was seen in office yesterday by Dr. Beryle Flock. Order was put in for abdominal US but its not until 12/14/20. Pt stated her pain comes and goes but when its present its like a 6/10. She reports she had a headache last night that increased her pain to a 10. Pt reports that her pain is so bad at times it makes her double over and has to hold onto a pillow. I called into the office and spoke with Joni Reining, [facility] who transferred me to [facility], CMA who reported no open appts for pt to be seen today or tomorrow to go to the ED. Advised pt to go to ED to be seen, she stated she didn't want to go to the ED and the only way she would go there is if her pain got so severe she couldn't deal with it anymore. Explained that going to UC wouldn't be effective but she could try that if she preferred to go there but pt said she will just wait. Care advice was given.   Reason for Disposition  [1] MILD-MODERATE pain AND [2] constant AND [3] present > 2 hours  Answer Assessment - Initial Assessment Questions 1. LOCATION: "Where does it hurt?"      Right lower quadrant 2. RADIATION: "Does the pain shoot anywhere else?" (e.g., chest, back)     Both legs but mostly right 3. ONSET: "When did the pain begin?" (e.g., minutes, hours or days ago)      yesterday 4. SUDDEN: "Gradual or sudden onset?"     sudden 5. PATTERN "Does the pain come and go, or is it constant?"    - If constant: "Is it getting better, staying the same, or worsening?"      (Note: Constant means the pain never goes away completely; most serious pain is constant and it progresses)     - If intermittent: "How long does it last?" "Do you have pain now?"     (Note: Intermittent means the pain goes away completely between bouts)     Come and go  6. SEVERITY: "How bad is the pain?"  (e.g., Scale 1-10; mild, moderate, or severe)   - MILD (1-3): doesn't interfere with normal activities, abdomen soft and  not tender to touch    - MODERATE (4-7): interferes with normal activities or awakens from sleep, abdomen tender to touch    - SEVERE (8-10): excruciating pain, doubled over, unable to do any normal activities      6 7. RECURRENT SYMPTOM: "Have you ever had this type of stomach pain before?" If Yes, ask: "When was the last time?" and "What happened that time?"      No 8. CAUSE: "What do you think is causing the stomach pain?"     unsure 9. RELIEVING/AGGRAVATING FACTORS: "What makes it better or worse?" (e.g., movement, antacids, bowel movement)     Laying still helps some 10. OTHER SYMPTOMS: "Do you have any other symptoms?" (e.g., back pain, diarrhea, fever, urination pain, vomiting)       Headache last  11. PREGNANCY: "Is there any chance you are pregnant?" "When was your last menstrual period?"       NO  Protocols used: Abdominal Pain - [facility]

## 2020-12-04 NOTE — Assessment & Plan Note (Signed)
BMI 39 and associated with HTN, HLD Discussed importance of healthy weight management Discussed diet and exercise

## 2020-12-07 ENCOUNTER — Telehealth: Payer: Self-pay

## 2020-12-07 NOTE — Telephone Encounter (Signed)
Copied from CRM [id number]. Topic: General - Inquiry >> Dec 07, 2020 11:46 AM Aretta Nip wrote: Pt had blood work done [employer] of last week and wants to know her blood type wanting to know if she is a carrier for a family disease.  CB  [phone]

## 2020-12-07 NOTE — Telephone Encounter (Signed)
Noted. Has she tried the [place] or Bentyl like we discussed?

## 2020-12-08 NOTE — Telephone Encounter (Signed)
lmtcb

## 2020-12-08 NOTE — Telephone Encounter (Signed)
Patient requesting  lab for thalassemia. Please advise.

## 2020-12-08 NOTE — Telephone Encounter (Signed)
Patient reports she has taken Gas-x and reports moderate pain control.

## 2020-12-08 NOTE — Telephone Encounter (Signed)
Call attempted no answer, lmtcb.

## 2020-12-10 NOTE — Telephone Encounter (Signed)
This cannot be seen on the labs that were drawn last week. Would need to get with next set of labs. Can discuss at next visit.

## 2020-12-11 NOTE — Telephone Encounter (Signed)
Patient advised.

## 2020-12-11 NOTE — Telephone Encounter (Signed)
Patient reports her abdominal pain is much better. Will keep appt for u/s on 12/14/20.

## 2020-12-14 ENCOUNTER — Other Ambulatory Visit: Payer: Self-pay

## 2020-12-14 ENCOUNTER — Ambulatory Visit
Admission: RE | Admit: 2020-12-14 | Discharge: 2020-12-14 | Disposition: A | Discharge status: Home / Self Care | Payer: Medicare HMO | Source: Ambulatory Visit | Attending: Family Medicine | Admitting: Family Medicine

## 2020-12-14 DIAGNOSIS — R1030 Lower abdominal pain, unspecified: Secondary | ICD-10-CM | POA: Diagnosis not present

## 2020-12-14 DIAGNOSIS — R140 Abdominal distension (gaseous): Secondary | ICD-10-CM | POA: Diagnosis not present

## 2020-12-14 DIAGNOSIS — K760 Fatty (change of) liver, not elsewhere classified: Secondary | ICD-10-CM | POA: Diagnosis not present

## 2020-12-17 ENCOUNTER — Ambulatory Visit: Payer: Self-pay | Admitting: *Deleted

## 2020-12-17 NOTE — Telephone Encounter (Signed)
Pt given ultra sound results per notes of Dr. Beryle Flock from 12/14/20 on 12/17/20. Pt verbalized understanding and reports she is still having abdominal distention and ok to refer to GI.

## 2020-12-18 ENCOUNTER — Other Ambulatory Visit: Payer: Self-pay

## 2020-12-18 DIAGNOSIS — R140 Abdominal distension (gaseous): Secondary | ICD-10-CM

## 2020-12-18 DIAGNOSIS — R1030 Lower abdominal pain, unspecified: Secondary | ICD-10-CM

## 2020-12-24 ENCOUNTER — Encounter: Payer: Self-pay | Admitting: Family Medicine

## 2021-03-02 ENCOUNTER — Encounter: Payer: Medicare HMO | Admitting: Family Medicine

## 2021-03-03 ENCOUNTER — Ambulatory Visit: Payer: Self-pay | Admitting: *Deleted

## 2021-03-03 NOTE — Telephone Encounter (Signed)
° ° ° ° ° °  Chief Complaint: Chest pain Symptoms: Left sided chest pain, "Sweating at times." "Think it's anxiety, I calm myself down and it helps."  Frequency: 2-3 months "Probably longer" "More stress." Pertinent Negatives: Patient denies SOB, pain does not radiate, no dizziness,no nausea Disposition: [] ED /[] Urgent Care (no appt availability in office) / [x] Appointment(In office/virtual)/ []  [facility]/ [] Home Care/ [] Refused Recommended Disposition /[] [facility]/ []  Follow-up with PCP Additional Notes: Appt made by agent prior to triage. Advised ANY worsening of symptoms, go to ED. Pt verbalizes understanding.  Reason for Disposition  [1] Chest pain lasts > 5 minutes AND [2] occurred > 3 days ago (72 hours) AND [3] NO chest pain or cardiac symptoms now  Answer Assessment - Initial Assessment Questions 1. LOCATION: "Where does it hurt?"       Left breast area 2. RADIATION: "Does the pain go anywhere else?" (e.g., into neck, jaw, arms, back)     No 3. ONSET: "When did the chest pain begin?" (Minutes, hours or days)      2-3 months 4. PATTERN "Does the pain come and go, or has it been constant since it started?"  "Does it get worse with exertion?"      Comes and goes       6. SEVERITY: "How bad is the pain?"  (e.g., Scale 1-10; mild, moderate, or severe)    - MILD (1-3): doesn't interfere with normal activities     - MODERATE (4-7): interferes with normal activities or awakens from sleep    - SEVERE (8-10): excruciating pain, unable to do any normal activities       4-5/10 7. CARDIAC RISK FACTORS: "Do you have any history of heart problems or risk factors for heart disease?" (e.g., angina, prior heart attack; diabetes, high blood pressure, high cholesterol, smoker, or strong family history of heart disease)      8. PULMONARY RISK FACTORS: "Do you have any history of lung disease?"  (e.g., blood clots in lung, asthma, emphysema, birth control pills)      9.  CAUSE: "What do you think is causing the chest pain?"     Anxiety 10. OTHER SYMPTOMS: "Do you have any other symptoms?" (e.g., dizziness, nausea, vomiting, sweating, fever, difficulty breathing, cough)       Cold sweats at times  Protocols used: Chest Pain-A-AH

## 2021-03-04 ENCOUNTER — Ambulatory Visit: Payer: Medicare HMO | Admitting: Family Medicine

## 2021-03-04 NOTE — Telephone Encounter (Signed)
Noted  

## 2021-03-04 NOTE — Progress Notes (Deleted)
I,Sha'taria Shakiya Mcneary,acting as a Education administrator for Lavon Paganini, MD.,have documented all relevant documentation on the behalf of Lavon Paganini, MD,as directed by  Lavon Paganini, MD while in the presence of Lavon Paganini, MD.   Established patient visit   Patient: Candace Faulkner   DOB: 1951-01-31   71 y.o. Female  MRN: [id number] Visit Date: 03/04/2021  Today's healthcare provider: Lavon Paganini, MD   No chief complaint on file.  Subjective    HPI  Chest Pain Presly Napp complains of chest pain. Onset was {numbers 1-14:11001} {time units:19031} ago. Symptoms have {course:19176} since that time. The patient's pain {pain pattern:19157}. The patient describes the pain as {desc; chest pain:19156} and {radiation:31138}. Patient rates pain as a {1/10-10/10:10902} in intensity. Associated symptoms are: {cardiac symptoms:12860}. Aggravating factors are: {chest pain aggravation:31141}. Alleviating factors are: {chest pain alleviating factors:31142}. Patient's cardiac risk factors are: {risk factors:510}. Patient's risk factors for DVT/PE: {risk factors:11319}. Previous cardiac testing: {cardiac tests:11254}. Hypertension, follow-up  BP Readings from Last 3 Encounters:  12/03/20 132/82  01/24/20 125/68   Wt Readings from Last 3 Encounters:  12/03/20 213 lb 12.8 oz (97 kg)  01/24/20 203 lb (92.1 kg)     She was last seen for hypertension 3 months ago. (12/03/20) BP at that visit was 132/82. Management since that visit includes continue current medications.  She reports {excellent/good/fair/poor:19665} compliance with treatment. She {is/is not:9024} having side effects. {document side effects if present:1} She is following a {diet:21022986} diet. She {is/is not:9024} exercising. She {does/does not:200015} smoke.  Use of agents associated with hypertension: {bp agents assoc with hypertension:511::"none"}.   Outside blood pressures are {***enter patient reported home BP readings, or  'not being checked':1}. Symptoms: {Yes/No:20286} chest pain {Yes/No:20286} chest pressure  {Yes/No:20286} palpitations {Yes/No:20286} syncope  {Yes/No:20286} dyspnea {Yes/No:20286} orthopnea  {Yes/No:20286} paroxysmal nocturnal dyspnea {Yes/No:20286} lower extremity edema   Pertinent labs: Lab Results  Component Value Date   CHOL 144 07/29/2019   HDL 40 07/29/2019   LDLCALC 79 07/29/2019   TRIG 82 07/29/2019   Lab Results  Component Value Date   NA 144 12/03/2020   K 4.0 12/03/2020   CREATININE 1.17 (H) 12/03/2020   EGFR 51 (L) 12/03/2020   GLUCOSE 104 (H) 12/03/2020   TSH 1.56 02/02/2018     The 10-year ASCVD risk score (Arnett DK, et al., 2019) is: 11.6%* (Cholesterol units were assumed)   ---------------------------------------------------------------------------------------------------   Medications: Outpatient Medications Prior to Visit  Medication Sig   dicyclomine (BENTYL) 10 MG capsule Take 1 capsule (10 mg total) by mouth 4 (four) times daily -  before meals and at bedtime.   hydrochlorothiazide (HYDRODIURIL) 25 MG tablet Take 1 tablet (25 mg total) by mouth daily.   pantoprazole (PROTONIX) 40 MG tablet TAKE 1 TABLET BY MOUTH EVERY DAY   venlafaxine (EFFEXOR) 37.5 MG tablet TAKE 1 TABLET BY MOUTH TWICE A DAY   No facility-administered medications prior to visit.    Review of Systems  Cardiovascular:  Positive for chest pain.   {Labs   Heme   Chem   Endocrine   Serology   Results Review (optional):23779}   Objective    There were no vitals taken for this visit. {Show previous vital signs (optional):23777}  Physical Exam  ***  No results found for any visits on 03/04/21.  Assessment & Plan     ***  No follow-ups on file.      {provider attestation***:1}   Lavon Paganini, MD  [facility] [phone] (phone) [phone] (fax)  [facility] ° °

## 2021-03-08 ENCOUNTER — Telehealth (INDEPENDENT_AMBULATORY_CARE_PROVIDER_SITE_OTHER): Payer: Medicare HMO | Admitting: Family Medicine

## 2021-03-08 NOTE — Progress Notes (Signed)
Patient cancelled and scheduled in person visit tomorrow instead.

## 2021-03-10 ENCOUNTER — Encounter: Payer: Self-pay | Admitting: Physician Assistant

## 2021-03-10 ENCOUNTER — Ambulatory Visit (INDEPENDENT_AMBULATORY_CARE_PROVIDER_SITE_OTHER): Payer: Medicare HMO | Admitting: Physician Assistant

## 2021-03-10 ENCOUNTER — Inpatient Hospital Stay (INDEPENDENT_AMBULATORY_CARE_PROVIDER_SITE_OTHER): Payer: Medicare HMO

## 2021-03-10 ENCOUNTER — Other Ambulatory Visit: Payer: Self-pay

## 2021-03-10 VITALS — BP 128/74 | HR 74 | Wt 210.0 lb

## 2021-03-10 DIAGNOSIS — R0602 Shortness of breath: Secondary | ICD-10-CM | POA: Diagnosis not present

## 2021-03-10 DIAGNOSIS — Z1231 Encounter for screening mammogram for malignant neoplasm of breast: Secondary | ICD-10-CM | POA: Diagnosis not present

## 2021-03-10 DIAGNOSIS — R079 Chest pain, unspecified: Secondary | ICD-10-CM

## 2021-03-10 DIAGNOSIS — Z87891 Personal history of nicotine dependence: Secondary | ICD-10-CM

## 2021-03-10 MED ORDER — ASPIRIN 81 MG PO TBEC: 81.0000 mg | DELAYED_RELEASE_TABLET | Freq: Every day | ORAL | 12 refills | Status: AC

## 2021-03-10 MED ORDER — NITROGLYCERIN 0.4 MG SL SUBL: 0.4000 mg | SUBLINGUAL_TABLET | SUBLINGUAL | 3 refills | Status: AC | PRN

## 2021-03-10 NOTE — Progress Notes (Signed)
Established patient visit   Patient: Candace Faulkner   DOB: 08-02-1950   71 y.o. Female  MRN: [id number] Visit Date: 03/10/2021  Today's healthcare provider: Alfredia Ferguson, PA-C   Chief Complaint  Patient presents with   Chest Pain   Subjective     Candace Faulkner is a 71 y/o female who presents today with new chest pain that occurs a few times a week for the last 2-3 months. Describes the pain as a squeezing chest pain that causes her to be SOB and diaphoretic. Lasts anywhere from 2-5 minutes and then dissipates on its own. Denies ever lasting longer or having different quality of symptoms. Denies associated headache, vision changes, dizziness, nausea. Occurs at rest and with activity, but to note SOB usually occurs first with activity. She also feels this pressure at night sometimes before she goes to sleep after a long day of activity, ie cleaning her house.  Not currently on statin, last lipid panel 6/21 total cholesterol was 144, LDL 79.  Mother w/ history of CVD and bypass.   Medications: Outpatient Medications Prior to Visit  Medication Sig   dicyclomine (BENTYL) 10 MG capsule Take 1 capsule (10 mg total) by mouth 4 (four) times daily -  before meals and at bedtime.   hydrochlorothiazide (HYDRODIURIL) 25 MG tablet Take 1 tablet (25 mg total) by mouth daily.   pantoprazole (PROTONIX) 40 MG tablet TAKE 1 TABLET BY MOUTH EVERY DAY   venlafaxine (EFFEXOR) 37.5 MG tablet TAKE 1 TABLET BY MOUTH TWICE A DAY   No facility-administered medications prior to visit.    Review of Systems  Constitutional: Negative.   Respiratory: Negative.    Cardiovascular:  Positive for chest pain. Negative for palpitations and leg swelling.  Gastrointestinal: Negative.   Neurological:  Negative for dizziness, light-headedness and headaches.      Objective    Blood pressure 128/74, pulse 74, weight 210 lb (95.3 kg), SpO2 98 %.    Physical Exam Constitutional:      General: She is awake.      Appearance: She is well-developed.  HENT:     Head: Normocephalic.  Eyes:     Conjunctiva/sclera: Conjunctivae normal.  Cardiovascular:     Rate and Rhythm: Normal rate and regular rhythm.     Heart sounds: Normal heart sounds. No murmur heard. Pulmonary:     Effort: Pulmonary effort is normal.     Breath sounds: Normal breath sounds.  Skin:    General: Skin is warm.  Neurological:     Mental Status: She is alert and oriented to person, place, and time.  Psychiatric:        Attention and Perception: Attention normal.        Mood and Affect: Mood normal.        Speech: Speech normal.        Behavior: Behavior is cooperative.     No results found for any visits on 03/10/21.  Assessment & Plan     Problem List Items Addressed This Visit       Other   Chest pain - Primary    EKG normal sinus, no ST elevation, depression, T wave inversion, no q waves. No current chest pain symptoms. Advised she start 81 mg ASA daily, rx nitro in case of chest pain > 5 min or different symptoms. Explained use and to call 911 or go to ED if she feels the need to take. Explained in detail when she would need  to go to ED for symptoms Refer to cardio  CXR ordered d/t cocurrent SOB, 10+ year pack history Also ordered zio monitor proactively, but if cardio feels should monitor differently can defer to cardio plan  Not currently on statin -- The 10-year ASCVD risk score (Arnett DK, et al., 2019) is: 10.9%* (Cholesterol units were assumed) ,should recheck lipid panel and determine need preventatively given fhx and current symptoms      Relevant Medications   aspirin 81 MG EC tablet   nitroGLYCERIN (NITROSTAT) 0.4 MG SL tablet   Other Relevant Orders   EKG 12-Lead (Completed)   Ambulatory referral to Cardiology   LONG TERM MONITOR (3-14 DAYS)   Lipid Profile   Comprehensive Metabolic Panel (CMET)   Other Visit Diagnoses     Encounter for screening mammogram for malignant neoplasm of breast        Relevant Orders   MM DIAG BREAST TOMO BILATERAL   Shortness of breath       Relevant Orders   DG Chest 2 View   History of tobacco use       Relevant Orders   DG Chest 2 View       I, Alfredia Ferguson, PA-C have reviewed all documentation for this visit. The documentation on  03/10/2021 for the exam, diagnosis, procedures, and orders are all accurate and complete.    Alfredia Ferguson, PA-C  [facility] [phone] (phone) [phone] (fax)  [facility]

## 2021-03-10 NOTE — Assessment & Plan Note (Addendum)
EKG normal sinus, no ST elevation, depression, T wave inversion, no q waves. No current chest pain symptoms. Advised she start 81 mg ASA daily, rx nitro in case of chest pain > 5 min or different symptoms. Explained use and to call 911 or go to ED if she feels the need to take. Explained in detail when she would need to go to ED for symptoms Refer to cardio  CXR ordered d/t cocurrent SOB, 10+ year pack history Also ordered zio monitor proactively, but if cardio feels should monitor differently can defer to cardio plan  Not currently on statin -- The 10-year ASCVD risk score (Arnett DK, et al., 2019) is: 10.9%* (Cholesterol units were assumed) ,should recheck lipid panel and determine need preventatively given fhx and current symptoms

## 2021-03-12 ENCOUNTER — Other Ambulatory Visit: Payer: Self-pay

## 2021-03-12 ENCOUNTER — Ambulatory Visit: Payer: Medicare HMO | Admitting: Cardiology

## 2021-03-12 ENCOUNTER — Other Ambulatory Visit
Admission: RE | Admit: 2021-03-12 | Discharge: 2021-03-12 | Disposition: A | Discharge status: Home / Self Care | Payer: Medicare HMO | Attending: Cardiology | Admitting: Cardiology

## 2021-03-12 ENCOUNTER — Encounter: Payer: Self-pay | Admitting: Cardiology

## 2021-03-12 VITALS — BP 140/90 | HR 75 | Ht 62.0 in | Wt 208.0 lb

## 2021-03-12 DIAGNOSIS — I10 Essential (primary) hypertension: Secondary | ICD-10-CM | POA: Diagnosis not present

## 2021-03-12 DIAGNOSIS — Z6838 Body mass index (BMI) 38.0-38.9, adult: Secondary | ICD-10-CM

## 2021-03-12 DIAGNOSIS — R072 Precordial pain: Secondary | ICD-10-CM | POA: Insufficient documentation

## 2021-03-12 LAB — BASIC METABOLIC PANEL
Anion gap: 8 (ref 5–15)
BUN: 14 mg/dL (ref 8–23)
CO2: 32 mmol/L (ref 22–32)
Calcium: 9.4 mg/dL (ref 8.9–10.3)
Chloride: 101 mmol/L (ref 98–111)
Creatinine, Ser: 1.11 mg/dL — ABNORMAL HIGH (ref 0.44–1.00)
GFR, Estimated: 54 mL/min — ABNORMAL LOW (ref >60)
Glucose, Bld: 109 mg/dL — ABNORMAL HIGH (ref 70–99)
Potassium: 4 mmol/L (ref 3.5–5.1)
Sodium: 141 mmol/L (ref 135–145)

## 2021-03-12 MED ORDER — IVABRADINE HCL 5 MG PO TABS: 10.0000 mg | ORAL_TABLET | Freq: Once | ORAL | 0 refills | Status: AC

## 2021-03-12 MED ORDER — METOPROLOL TARTRATE 100 MG PO TABS: 100.0000 mg | ORAL_TABLET | Freq: Once | ORAL | 0 refills | Status: DC

## 2021-03-12 NOTE — Patient Instructions (Signed)
Medication Instructions:   Your physician recommends that you continue on your current medications as directed. Please refer to the Current Medication list given to you today.  *If you need a refill on your cardiac medications before your next appointment, please call your pharmacy*   Lab Work:  BMP to be drawn today at the medical mall  If you have labs (blood work) drawn today and your tests are completely normal, you will receive your results only by: MyChart Message (if you have MyChart) OR A paper copy in the mail If you have any lab test that is abnormal or we need to change your treatment, we will call you to review the results.   Testing/Procedures:   Your physician has requested that you have an echocardiogram. Echocardiography is a painless test that uses sound waves to create images of your heart. It provides your doctor with information about the size and shape of your heart and how well your hearts chambers and valves are working. This procedure takes approximately one hour. There are no restrictions for this procedure.  2.   Your physician has requested that you have cardiac CT. Cardiac computed tomography (CT) is a painless test that uses an x-ray machine to take clear, detailed pictures of your heart.   Your cardiac CT will be scheduled at:  [facility] [address] [place], [place] [address] [phone]   Thursday 03/25/21 at 1:15 PM   Please arrive 15 mins early for check-in and test prep.    Please follow these instructions carefully (unless otherwise directed):    On the Night Before the Test: Be sure to Drink plenty of water. Do not consume any caffeinated/decaffeinated beverages or chocolate 12 hours prior to your test.    On the Day of the Test: Drink plenty of water until 1 hour prior to the test. Do not eat any food 4 hours prior to the test. You may take your regular medications prior to the  test.  Take metoprolol (Lopressor) 100 MG two hours prior to test. Take Ivabradine (Corlanor) 10 MG two hours prior to test. HOLD Hydrochlorothiazide morning of the test. FEMALES- please wear underwire-free bra if available, avoid dresses & tight clothing        After the Test: Drink plenty of water. After receiving IV contrast, you may experience a mild flushed feeling. This is normal. On occasion, you may experience a mild rash up to 24 hours after the test. This is not dangerous. If this occurs, you can take Benadryl 25 mg and increase your fluid intake. If you experience trouble breathing, this can be serious. If it is severe call 911 IMMEDIATELY. If it is mild, please call our office. If you take any of these medications: Glipizide/Metformin, Avandament, Glucavance, please do not take 48 hours after completing test unless otherwise instructed.  Please allow 2-4 weeks for scheduling of routine cardiac CTs. Some insurance companies require a pre-authorization which may delay scheduling of this test.   For non-scheduling related questions, please contact the cardiac imaging nurse navigator should you have any questions/concerns: Rockwell Alexandria, Cardiac Imaging Nurse Navigator Larey Brick, Cardiac Imaging Nurse Navigator [facility] Direct Office Dial: [phone]   For scheduling needs, including cancellations and rescheduling, please call [place], [phone].      Follow-Up: At [facility], you and your health needs are our priority.  As part of our continuing mission to provide you with exceptional heart care, we  have created designated Provider Care Teams.  These Care Teams include your primary Cardiologist (physician) and Advanced Practice Providers (APPs -  Physician Assistants and Nurse Practitioners) who all work together to provide you with the care you need, when you need it.  We recommend signing up for the patient portal called "MyChart".   Sign up information is provided on this After Visit Summary.  MyChart is used to connect with patients for Virtual Visits (Telemedicine).  Patients are able to view lab/test results, encounter notes, upcoming appointments, etc.  Non-urgent messages can be sent to your provider as well.   To learn more about what you can do with MyChart, go to [email or web address].    Your next appointment:   Follow up after testing  ([facility]  03/25/21)  The format for your next appointment:   In Person  Provider:   You may see Debbe Odea, MD or one of the following Advanced Practice Providers on your designated Care Team:   Nicolasa Ducking, NP Eula Listen, PA-C Cadence Fransico Michael, [place]    Other Instructions

## 2021-03-12 NOTE — Progress Notes (Signed)
Cardiology Office Note:    Date:  03/12/2021   ID:  Candace Faulkner, DOB 01-12-1951, MRN [id number]  PCP:  Virginia Crews, MD   [facility] Providers Cardiologist:  Kate Sable, MD     Referring MD: Mikey Kirschner, PA-C   Chief Complaint  Patient presents with   New Patient (Initial Visit)    New patient to establish care with provider for intermittent chest pain for about 2 weeks, also have cold sweats when the chest pains in occurring. However the chest pain has not been enough for the need to take Nitroglycerin. Medications verbally reviewed with patient.     History of Present Illness:    Candace Faulkner is a 71 y.o. female with a hx of hypertension, former smoker x10+ years, who presents due to chest pain.  Patient states having symptoms of chest pains on and off over the past 2 weeks.  Describes symptoms as pressure-like located on the left side associated with diaphoresis.  Symptoms typically last 2 to 3 minutes, not related with exertion.  Her mother had CABG in her 61s.  States having history of a heart murmur, has some lower extremity swelling all the time.  Attributes leg swelling to her weight.  Past Medical History:  Diagnosis Date   Anxiety    Fatty liver    Heart murmur    Hyperlipidemia    Hypertension     Past Surgical History:  Procedure Laterality Date   ABDOMINAL HYSTERECTOMY     BACK SURGERY     L4-L5 disc surgery   SHOULDER ARTHROSCOPY Right    ligament repair    Current Medications: Current Meds  Medication Sig   aspirin 81 MG EC tablet Take 1 tablet (81 mg total) by mouth daily. Swallow whole.   hydrochlorothiazide (HYDRODIURIL) 25 MG tablet Take 1 tablet (25 mg total) by mouth daily.   ivabradine (CORLANOR) 5 MG TABS tablet Take 2 tablets (10 mg total) by mouth once for 1 dose. Take 2 hours prior to your CT scan.   metoprolol tartrate (LOPRESSOR) 100 MG tablet Take 1 tablet (100 mg total) by mouth once for 1 dose. Take 2 hours prior to  your CT scan.   nitroGLYCERIN (NITROSTAT) 0.4 MG SL tablet Place 1 tablet (0.4 mg total) under the tongue every 5 (five) minutes as needed for chest pain. up to 3 tablets in 15 minutes. If you feel the need to take a tablet, please call 911   pantoprazole (PROTONIX) 40 MG tablet TAKE 1 TABLET BY MOUTH EVERY DAY   venlafaxine (EFFEXOR) 37.5 MG tablet TAKE 1 TABLET BY MOUTH TWICE A DAY     Allergies:   Latex   Social History   Socioeconomic History   Marital status: Married    Spouse name: Not on file   Number of children: 7   Years of education: 11   Highest education level: Not on file  Occupational History   Occupation: retired  Tobacco Use   Smoking status: Former    Packs/day: 0.50    Years: 20.00    Pack years: 10.00    Types: Cigarettes    Quit date: 02/14/1989    Years since quitting: 32.0   Smokeless tobacco: Never  Vaping Use   Vaping Use: Every day  Substance and Sexual Activity   Alcohol use: Not Currently   Drug use: Never   Sexual activity: Yes    Partners: Male    Birth control/protection: Post-menopausal, Surgical  Other Topics  Concern   Not on file  Social History Narrative   Not on file   Social Determinants of Health   Financial Resource Strain: Not on file  Food Insecurity: Not on file  Transportation Needs: Not on file  Physical Activity: Not on file  Stress: Not on file  Social Connections: Not on file     Family History: The patient's family history includes Diabetes in her brother. There is no history of Hypertension, Breast cancer, or Colon cancer.  ROS:   Please see the history of present illness.     All other systems reviewed and are negative.  EKGs/Labs/Other Studies Reviewed:    The following studies were reviewed today:   EKG:  EKG is  ordered today.  The ekg ordered today demonstrates normal sinus rhythm  Recent Labs: 12/03/2020: ALT 45; BUN 15; Creatinine, Ser 1.17; Hemoglobin 15.1; Platelets 275; Potassium 4.0; Sodium 144   Recent Lipid Panel    Component Value Date/Time   CHOL 144 07/29/2019 0000   TRIG 82 07/29/2019 0000   HDL 40 07/29/2019 0000   LDLCALC 79 07/29/2019 0000     Risk Assessment/Calculations:          Physical Exam:    VS:  BP 140/90 (BP Location: Right Arm, Patient Position: Sitting, Cuff Size: Normal)    Pulse 75    Ht 5\' 2"  (1.575 m)    Wt 208 lb (94.3 kg)    SpO2 93%    BMI 38.04 kg/m     Wt Readings from Last 3 Encounters:  03/12/21 208 lb (94.3 kg)  03/10/21 210 lb (95.3 kg)  12/03/20 213 lb 12.8 oz (97 kg)     GEN:  Well nourished, well developed in no acute distress HEENT: Normal NECK: No JVD; No carotid bruits LYMPHATICS: No lymphadenopathy CARDIAC: RRR, no murmurs, rubs, gallops RESPIRATORY:  Clear to auscultation without rales, wheezing or rhonchi  ABDOMEN: Soft, non-tender, non-distended MUSCULOSKELETAL:  trace edema; No deformity  SKIN: Warm and dry NEUROLOGIC:  Alert and oriented x 3 PSYCHIATRIC:  Normal affect   ASSESSMENT:    1. Precordial pain   2. Primary hypertension   3. BMI 38.0-38.9,adult    PLAN:    In order of problems listed above:  Chest pain, risk factors hypertension, former smoker.  Get echo, get coronary CTA.  Continue aspirin 81 mg, sublingual nitro as needed pain. Hypertension, BP slightly elevated, usually controlled.  Continue HCTZ 25 mg daily. Obesity, low-calorie diet, weight loss advised.  Follow-up after echo and coronary CTA.      Medication Adjustments/Labs and Tests Ordered: Current medicines are reviewed at length with the patient today.  Concerns regarding medicines are outlined above.  Orders Placed This Encounter  Procedures   CT CORONARY MORPH W/CTA COR W/SCORE W/CA W/CM &/OR WO/CM   Basic metabolic panel   EKG [id number]   ECHOCARDIOGRAM COMPLETE   Meds ordered this encounter  Medications   metoprolol tartrate (LOPRESSOR) 100 MG tablet    Sig: Take 1 tablet (100 mg total) by mouth once for 1 dose. Take 2  hours prior to your CT scan.    Dispense:  1 tablet    Refill:  0   ivabradine (CORLANOR) 5 MG TABS tablet    Sig: Take 2 tablets (10 mg total) by mouth once for 1 dose. Take 2 hours prior to your CT scan.    Dispense:  2 tablet    Refill:  0    Patient Instructions  Medication Instructions:   Your physician recommends that you continue on your current medications as directed. Please refer to the Current Medication list given to you today.  *If you need a refill on your cardiac medications before your next appointment, please call your pharmacy*   Lab Work:  BMP to be drawn today at the medical mall  If you have labs (blood work) drawn today and your tests are completely normal, you will receive your results only by: [place] (if you have MyChart) OR A paper copy in the mail If you have any lab test that is abnormal or we need to change your treatment, we will call you to review the results.   Testing/Procedures:   Your physician has requested that you have an echocardiogram. Echocardiography is a painless test that uses sound waves to create images of your heart. It provides your doctor with information about the size and shape of your heart and how well your hearts chambers and valves are working. This procedure takes approximately one hour. There are no restrictions for this procedure.  2.   Your physician has requested that you have cardiac CT. Cardiac computed tomography (CT) is a painless test that uses an x-ray machine to take clear, detailed pictures of your heart.   Your cardiac CT will be scheduled at:  [facility] [address] [place], [place] [address] [phone]   Thursday 03/25/21 at 1:15 PM   Please arrive 15 mins early for check-in and test prep.    Please follow these instructions carefully (unless otherwise directed):    On the Night Before the Test: Be sure to Drink plenty of water. Do not  consume any caffeinated/decaffeinated beverages or chocolate 12 hours prior to your test.    On the Day of the Test: Drink plenty of water until 1 hour prior to the test. Do not eat any food 4 hours prior to the test. You may take your regular medications prior to the test.  Take metoprolol (Lopressor) 100 MG two hours prior to test. Take Ivabradine (Corlanor) 10 MG two hours prior to test. HOLD Hydrochlorothiazide morning of the test. FEMALES- please wear underwire-free bra if available, avoid dresses & tight clothing        After the Test: Drink plenty of water. After receiving IV contrast, you may experience a mild flushed feeling. This is normal. On occasion, you may experience a mild rash up to 24 hours after the test. This is not dangerous. If this occurs, you can take Benadryl 25 mg and increase your fluid intake. If you experience trouble breathing, this can be serious. If it is severe call 911 IMMEDIATELY. If it is mild, please call our office. If you take any of these medications: Glipizide/Metformin, Avandament, Glucavance, please do not take 48 hours after completing test unless otherwise instructed.  Please allow 2-4 weeks for scheduling of routine cardiac CTs. Some insurance companies require a pre-authorization which may delay scheduling of this test.   For non-scheduling related questions, please contact the cardiac imaging nurse navigator should you have any questions/concerns: Marchia Bond, Cardiac Imaging Nurse Navigator Gordy Clement, Cardiac Imaging Nurse Navigator [facility] Direct Office Dial: [phone]   For scheduling needs, including cancellations and rescheduling, please call [place], [phone].      Follow-Up: At [facility], you and your health needs are our priority.  As part of our continuing mission to provide you with exceptional heart care, we  have created designated Provider Care Teams.  These Care Teams  include your primary Cardiologist (physician) and Advanced Practice Providers (APPs -  Physician Assistants and Nurse Practitioners) who all work together to provide you with the care you need, when you need it.  We recommend signing up for the patient portal called "MyChart".  Sign up information is provided on this After Visit Summary.  MyChart is used to connect with patients for Virtual Visits (Telemedicine).  Patients are able to view lab/test results, encounter notes, upcoming appointments, etc.  Non-urgent messages can be sent to your provider as well.   To learn more about what you can do with MyChart, go to [email or web address].    Your next appointment:   Follow up after testing  (CCTA  03/25/21)  The format for your next appointment:   In Person  Provider:   You may see Kate Sable, MD or one of the following Advanced Practice Providers on your designated Care Team:   Murray Hodgkins, NP Christell Faith, PA-C Cadence Kathlen Mody, [place]    Other Instructions     Signed, Kate Sable, MD  03/12/2021 4:51 PM    [place]

## 2021-03-23 ENCOUNTER — Telehealth (HOSPITAL_COMMUNITY): Payer: Self-pay | Admitting: Emergency Medicine

## 2021-03-23 NOTE — Telephone Encounter (Signed)
Attempted to call patient regarding upcoming cardiac CT appointment. °Left message on voicemail with name and callback number °Gary Gabrielsen RN Navigator Cardiac Imaging °[facility] °[phone] Office °[phone] Cell ° °

## 2021-03-25 ENCOUNTER — Ambulatory Visit: Admission: RE | Admit: 2021-03-25 | Payer: Medicare HMO | Source: Ambulatory Visit

## 2021-04-02 ENCOUNTER — Ambulatory Visit (INDEPENDENT_AMBULATORY_CARE_PROVIDER_SITE_OTHER): Payer: Medicare HMO

## 2021-04-02 ENCOUNTER — Other Ambulatory Visit: Payer: Self-pay

## 2021-04-02 DIAGNOSIS — R072 Precordial pain: Secondary | ICD-10-CM | POA: Diagnosis not present

## 2021-04-02 LAB — ECHOCARDIOGRAM COMPLETE
AR max vel: 2.85 cm2
AV Area VTI: 3.11 cm2
AV Area mean vel: 2.82 cm2
AV Mean grad: 3 mmHg
AV Peak grad: 5.7 mmHg
Ao pk vel: 1.19 m/s
Area-P 1/2: 3.99 cm2
Calc EF: 76.9 %
MV VTI: 3.29 cm2
S' Lateral: 3.4 cm
Single Plane A2C EF: 75.2 %
Single Plane A4C EF: 77 %

## 2021-04-05 DIAGNOSIS — R079 Chest pain, unspecified: Secondary | ICD-10-CM | POA: Diagnosis not present

## 2021-04-08 ENCOUNTER — Ambulatory Visit: Payer: Medicare HMO

## 2021-04-09 ENCOUNTER — Telehealth (HOSPITAL_COMMUNITY): Payer: Self-pay | Admitting: *Deleted

## 2021-04-09 NOTE — Telephone Encounter (Signed)
Attempted to call patient regarding upcoming cardiac CT appointment. °Left message on voicemail with name and callback number ° °Marieclaire Bettenhausen RN Navigator Cardiac Imaging °[facility] °[phone] Office °[phone] Cell ° °

## 2021-04-12 ENCOUNTER — Ambulatory Visit
Admission: RE | Admit: 2021-04-12 | Discharge: 2021-04-12 | Disposition: A | Discharge status: Home / Self Care | Payer: Medicare HMO | Source: Ambulatory Visit | Attending: Cardiology | Admitting: Cardiology

## 2021-04-12 ENCOUNTER — Other Ambulatory Visit: Payer: Self-pay

## 2021-04-12 ENCOUNTER — Other Ambulatory Visit: Payer: Self-pay | Admitting: Cardiology

## 2021-04-12 DIAGNOSIS — R072 Precordial pain: Secondary | ICD-10-CM | POA: Insufficient documentation

## 2021-04-12 DIAGNOSIS — R931 Abnormal findings on diagnostic imaging of heart and coronary circulation: Secondary | ICD-10-CM

## 2021-04-12 DIAGNOSIS — I2510 Atherosclerotic heart disease of native coronary artery without angina pectoris: Secondary | ICD-10-CM | POA: Diagnosis not present

## 2021-04-12 MED ORDER — NITROGLYCERIN 0.4 MG SL SUBL
0.8000 mg | SUBLINGUAL_TABLET | Freq: Once | SUBLINGUAL | Status: AC
Start: 2021-04-12 — End: 2021-04-12
  Administered 2021-04-12: 0.8 mg via SUBLINGUAL

## 2021-04-12 MED ORDER — METOPROLOL TARTRATE 5 MG/5ML IV SOLN: 10.0000 mg | Freq: Once | INTRAVENOUS | Status: DC

## 2021-04-12 MED ORDER — IOHEXOL 350 MG/ML SOLN
80.0000 mL | Freq: Once | INTRAVENOUS | Status: AC | PRN
  Administered 2021-04-12: 80 mL via INTRAVENOUS

## 2021-04-12 NOTE — Progress Notes (Signed)
Patient tolerated procedure well. Ambulate w/o difficulty. Denies light headedness or being dizzy. Sitting in chair drinking water provided. Encouraged to drink extra water today and reasoning explained. Verbalized understanding. All questions answered. ABC intact. No further needs. Discharge from procedure area w/o issues.   °

## 2021-04-13 ENCOUNTER — Other Ambulatory Visit: Payer: Self-pay | Admitting: Family Medicine

## 2021-04-13 DIAGNOSIS — I2510 Atherosclerotic heart disease of native coronary artery without angina pectoris: Secondary | ICD-10-CM | POA: Diagnosis not present

## 2021-04-13 DIAGNOSIS — R931 Abnormal findings on diagnostic imaging of heart and coronary circulation: Secondary | ICD-10-CM | POA: Diagnosis not present

## 2021-04-13 NOTE — Telephone Encounter (Signed)
Requested Prescriptions  Pending Prescriptions Disp Refills   pantoprazole (PROTONIX) 40 MG tablet [Pharmacy Med Name: PANTOPRAZOLE SOD DR 40 MG TAB] 90 tablet 0    Sig: TAKE 1 TABLET BY MOUTH EVERY DAY     Gastroenterology: Proton Pump Inhibitors Passed - 04/13/2021  1:42 AM      Passed - Valid encounter within last 12 months    Recent Outpatient Visits          1 month ago Chest pain, unspecified type   [facility] Mikey Kirschner, PA-C   1 month ago Erroneous encounter - disregard   [facility] [place], Dionne Bucy, MD   4 months ago Primary hypertension   [facility] [place], Dionne Bucy, MD   6 months ago Lower extremity edema   [facility] [place], Dionne Bucy, MD   1 year ago Primary hypertension   [place], Dionne Bucy, MD      Future Appointments            In 6 days Agbor-Etang, Aaron Edelman, MD [facility]   In 3 months Bac[place]al[place], Dionne Bucy, MD [facility], [place]

## 2021-04-14 ENCOUNTER — Telehealth: Payer: Self-pay

## 2021-04-14 DIAGNOSIS — I25118 Atherosclerotic heart disease of native coronary artery with other forms of angina pectoris: Secondary | ICD-10-CM

## 2021-04-14 MED ORDER — ISOSORBIDE MONONITRATE ER 30 MG PO TB24: 15.0000 mg | ORAL_TABLET | Freq: Every day | ORAL | 3 refills | Status: DC

## 2021-04-14 NOTE — Telephone Encounter (Signed)
-----   Message from Debbe Odea, MD sent at 04/13/2021  2:42 PM EST ----- ?Mild non obstructive coronary artery disease. Cont aspirin, obtain fasting lipid profile. Start imdur 15mg  qd for antianginal benefit. ?

## 2021-04-14 NOTE — Telephone Encounter (Signed)
Called patient and left a detailed VM per DPR on file. Requested that patient call back to schedule her fasting Lipid prior to her follow up on 04/19/21. ?

## 2021-04-16 ENCOUNTER — Ambulatory Visit: Payer: Self-pay

## 2021-04-16 ENCOUNTER — Telehealth: Payer: Self-pay | Admitting: Cardiology

## 2021-04-16 DIAGNOSIS — E782 Mixed hyperlipidemia: Secondary | ICD-10-CM

## 2021-04-16 NOTE — Telephone Encounter (Signed)
Pt c/o medication issue:  1. Name of Medication: Isosorbide  2. How are you currently taking this medication (dosage and times per day)? 30 mg dailey  3. Are you having a reaction (difficulty breathing--STAT)?   4. What is your medication issue? Headaches daily since started this medication.   Please call to discuss.

## 2021-04-16 NOTE — Telephone Encounter (Signed)
Spoke with patient and she is taking Isosorbide 15 MG daily and has been getting headaches. I advised that she go ahead and stop taking it per Dr. Azucena Cecil. Patient will be in office for an OV on Monday 04/19/21 and will discuss other options with Dr. Azucena Cecil at that time. Patient will be getting her F. Lipid drawn at the [facility] tomorrow morning so results will be available for the OV. ? ?Patient was grateful for the call back. ?

## 2021-04-16 NOTE — Telephone Encounter (Signed)
?  Chief Complaint: Candace Faulkner form new medication Imdur ?Symptoms: severe HA ?Frequency: since starting edication ?Pertinent Negatives: Patient denies fever dizziness ?Disposition: [] ED /[] Urgent Care (no appt availability in office) / [] Appointment(In office/virtual)/ []  [place] [facility]/ [] Home Care/ [] Refused Recommended Disposition /[] [place] [facility]/ [x]  Follow-up with PCP ?Additional Notes: Pt advised to call cardiologist who prescribed drug for advise.  Pt will call back if needed.  ? ? ? ?Summary: Medication Advice - Medication gives patient headaches  ? Pt is calling to report that isosorbide mononitrate (IMDUR) 30 MG 24 hr tablet gives her headaches. Ever since she has had this medication she gets a headache.  Pt wants to know should she continue to take the medication   ?  ? ?Reason for Disposition ? [1] Caller has NON-URGENT medicine question about med that PCP prescribed AND [2] triager unable to answer question ? ?Answer Assessment - Initial Assessment Questions ?1. NAME of MEDICATION: "What medicine are you calling about?" ?    Imdur ?2. QUESTION: "What is your question?" (e.g., double dose of medicine, side effect) ?    Side effect - HA ?3. PRESCRIBING HCP: "Who prescribed it?" Reason: if prescribed by specialist, call should be referred to that group. ?    Cardiology ?4. SYMPTOMS: "Do you have any symptoms?" ?    HA ?5. SEVERITY: If symptoms are present, ask "Are they mild, moderate or severe?" ?    severe ?6. PREGNANCY:  "Is there any chance that you are pregnant?" "When was your last menstrual period?" ?    na ? ?Protocols used: Medication Question Call-A-AH ? ?

## 2021-04-19 ENCOUNTER — Encounter: Payer: Self-pay | Admitting: Cardiology

## 2021-04-19 ENCOUNTER — Other Ambulatory Visit: Payer: Self-pay

## 2021-04-19 ENCOUNTER — Ambulatory Visit: Payer: Medicare HMO | Admitting: Cardiology

## 2021-04-19 VITALS — BP 144/80 | HR 100 | Ht 62.0 in | Wt 201.0 lb

## 2021-04-19 DIAGNOSIS — I10 Essential (primary) hypertension: Secondary | ICD-10-CM

## 2021-04-19 DIAGNOSIS — I2510 Atherosclerotic heart disease of native coronary artery without angina pectoris: Secondary | ICD-10-CM

## 2021-04-19 DIAGNOSIS — Z6836 Body mass index (BMI) 36.0-36.9, adult: Secondary | ICD-10-CM | POA: Diagnosis not present

## 2021-04-19 MED ORDER — ATORVASTATIN CALCIUM 20 MG PO TABS: 20.0000 mg | ORAL_TABLET | Freq: Every day | ORAL | 5 refills | Status: DC

## 2021-04-19 NOTE — Telephone Encounter (Signed)
Spoke with patient on 04/16/21 (see tele encounter) and gave her the result note for her CCTA. Patient also stated that she would get her Lipid panel on Sat.04/17/21. ?

## 2021-04-19 NOTE — Progress Notes (Signed)
?Cardiology Office Note:   ? ?Date:  04/19/2021  ? ?ID:  Candace Faulkner, DOB 1950-07-05, MRN [id number] ? ?PCP:  Erasmo Downer, MD ?  ?[facility] Providers ?Cardiologist:  Debbe Odea, MD    ? ?Referring MD: Erasmo Downer, MD  ? ?Chief Complaint  ?Patient presents with  ? Follow-up  ?  F/U after cardiac testing  ? ? ?History of Present Illness:   ? ?Candace Faulkner is a 71 y.o. female with a hx of hypertension, former smoker x10+ years, who presents for follow-up.  Previously seen due to chest pain.   ? ?Due to symptoms and risk factors, echocardiogram and coronary CTA was ordered.  Patient presents for results.  She has left-sided chest discomfort especially when she raises her left shoulder.  Has shortness of breath with exertion.  Blood pressures have stayed elevated of late with systolic in the 140s.  She is trying to eat healthier and exercise in order to lose weight. ? ?Prior notes/studies ?Echo 03/2021 EF 60 to 65%, mild MR. ?Coronary CTA 03/2021 mild to moderate proximal LAD stenosis, 40 to 50%, CT FFR with no significant stenosis.  Calcium score 86.7. ? ?Past Medical History:  ?Diagnosis Date  ? Anxiety   ? Fatty liver   ? Heart murmur   ? Hyperlipidemia   ? Hypertension   ? ? ?Past Surgical History:  ?Procedure Laterality Date  ? ABDOMINAL HYSTERECTOMY    ? BACK SURGERY    ? L4-L5 disc surgery  ? SHOULDER ARTHROSCOPY Right   ? ligament repair  ? ? ?Current Medications: ?Current Meds  ?Medication Sig  ? aspirin 81 MG EC tablet Take 1 tablet (81 mg total) by mouth daily. Swallow whole.  ? atorvastatin (LIPITOR) 20 MG tablet Take 1 tablet (20 mg total) by mouth daily.  ? hydrochlorothiazide (HYDRODIURIL) 25 MG tablet Take 1 tablet (25 mg total) by mouth daily.  ? nitroGLYCERIN (NITROSTAT) 0.4 MG SL tablet Place 1 tablet (0.4 mg total) under the tongue every 5 (five) minutes as needed for chest pain. up to 3 tablets in 15 minutes. If you feel the need to take a tablet, please call 911  ?  pantoprazole (PROTONIX) 40 MG tablet TAKE 1 TABLET BY MOUTH EVERY DAY  ? Simethicone (GAS RELIEF PO) Take by mouth as needed.  ? venlafaxine (EFFEXOR) 37.5 MG tablet TAKE 1 TABLET BY MOUTH TWICE A DAY  ?  ? ?Allergies:   Latex  ? ?Social History  ? ?Socioeconomic History  ? Marital status: Married  ?  Spouse name: Not on file  ? Number of children: 7  ? Years of education: 66  ? Highest education level: Not on file  ?Occupational History  ? Occupation: retired  ?Tobacco Use  ? Smoking status: Former  ?  Packs/day: 0.50  ?  Years: 20.00  ?  Pack years: 10.00  ?  Types: Cigarettes  ?  Quit date: 02/14/1989  ?  Years since quitting: 32.1  ? Smokeless tobacco: Never  ?Vaping Use  ? Vaping Use: Every day  ?Substance and Sexual Activity  ? Alcohol use: Not Currently  ? Drug use: Never  ? Sexual activity: Yes  ?  Partners: Male  ?  Birth control/protection: Post-menopausal, Surgical  ?Other Topics Concern  ? Not on file  ?Social History Narrative  ? Not on file  ? ?Social Determinants of Health  ? ?Financial Resource Strain: Not on file  ?Food Insecurity: Not on file  ?Transportation Needs: Not  on file  ?Physical Activity: Not on file  ?Stress: Not on file  ?Social Connections: Not on file  ?  ? ?Family History: ?The patient's family history includes Diabetes in her brother. There is no history of Hypertension, Breast cancer, or Colon cancer. ? ?ROS:   ?Please see the history of present illness.    ? All other systems reviewed and are negative. ? ?EKGs/Labs/Other Studies Reviewed:   ? ?The following studies were reviewed today: ? ? ?EKG:  EKG not ordered today.  ? ?Recent Labs: ?12/03/2020: ALT 45; Hemoglobin 15.1; Platelets 275 ?03/12/2021: BUN 14; Creatinine, Ser 1.11; Potassium 4.0; Sodium 141  ?Recent Lipid Panel ?   ?Component Value Date/Time  ? CHOL 144 07/29/2019 0000  ? TRIG 82 07/29/2019 0000  ? HDL 40 07/29/2019 0000  ? LDLCALC 79 07/29/2019 0000  ? ? ? ?Risk Assessment/Calculations:   ? ? ?    ? ?Physical Exam:    ? ?VS:  BP (!) 144/80 (BP Location: Left Arm, Patient Position: Sitting, Cuff Size: Large)   Pulse 100   Ht 5\' 2"  (1.575 m)   Wt 201 lb (91.2 kg)   SpO2 98%   BMI 36.76 kg/m?    ? ?Wt Readings from Last 3 Encounters:  ?04/19/21 201 lb (91.2 kg)  ?03/12/21 208 lb (94.3 kg)  ?03/10/21 210 lb (95.3 kg)  ?  ? ?GEN:  Well nourished, well developed in no acute distress ?HEENT: Normal ?NECK: No JVD; No carotid bruits ?CARDIAC: RRR, no murmurs, rubs, gallops ?RESPIRATORY:  Clear to auscultation without rales, wheezing or rhonchi  ?ABDOMEN: Soft, non-tender, non-distended ?MUSCULOSKELETAL:  trace edema; No deformity  ?SKIN: Warm and dry ?NEUROLOGIC:  Alert and oriented x 3 ?PSYCHIATRIC:  Normal affect  ? ?ASSESSMENT:   ? ?1. Coronary artery disease involving native heart, unspecified vessel or lesion type, unspecified whether angina present   ?2. Primary hypertension   ?3. BMI 36.0-36.9,adult   ? ? ?PLAN:   ? ?In order of problems listed above: ? ?CAD, mild to moderate proximal LAD stenosis 40 to 50%.  Echo EF 60 to 65% CT FFR with no significant stenosis.  Continue aspirin, start Lipitor 20 mg daily, obtain fasting lipid profile.  CP likely musculoskeletal with reproducibility with palpation. ?Hypertension, continue HCTZ 25 mg daily, DASH diet advised, if BP stays elevated at follow-up visit, plan to start Norvasc. ?Obesity, low-calorie diet, weight loss advised. ? ?Follow-up in 3 months ? ?   ? ?Medication Adjustments/Labs and Tests Ordered: ?Current medicines are reviewed at length with the patient today.  Concerns regarding medicines are outlined above.  ?No orders of the defined types were placed in this encounter. ? ?Meds ordered this encounter  ?Medications  ? atorvastatin (LIPITOR) 20 MG tablet  ?  Sig: Take 1 tablet (20 mg total) by mouth daily.  ?  Dispense:  30 tablet  ?  Refill:  5  ? ? ?Patient Instructions  ?Medication Instructions:  ? ?Your physician has recommended you make the following change in your  medication:  ? ?START Lipitor 20 MG once a day. ? ?*If you need a refill on your cardiac medications before your next appointment, please call your pharmacy* ? ? ?Lab Work: ? ?Your physician recommends that you return for a FASTING lipid profile: At your earliest convenience ? ?- You will need to be fasting. Please do not have anything to eat or drink after midnight the morning you have the lab work. You may only have water or  black coffee with no cream or sugar.  ? ?- Please go to the [facility]. You will check in at the front desk to the right as you walk into the atrium. Valet Parking is offered if needed. ?- No appointment needed. You may go any day between 7 am and 6 pm.  ? ? ? ?Testing/Procedures: ?None ordered ? ? ?Follow-Up: ?At [facility], you and your health needs are our priority.  As part of our continuing mission to provide you with exceptional heart care, we have created designated Provider Care Teams.  These Care Teams include your primary Cardiologist (physician) and Advanced Practice Providers (APPs -  Physician Assistants and Nurse Practitioners) who all work together to provide you with the care you need, when you need it. ? ?We recommend signing up for the patient portal called "MyChart".  Sign up information is provided on this After Visit Summary.  MyChart is used to connect with patients for Virtual Visits (Telemedicine).  Patients are able to view lab/test results, encounter notes, upcoming appointments, etc.  Non-urgent messages can be sent to your provider as well.   ?To learn more about what you can do with MyChart, go to [email or web address].   ? ?Your next appointment:   ?3 month(s) ? ?The format for your next appointment:   ?In Person ? ?Provider:   ?You may see Debbe Odea, MD or one of the following Advanced Practice Providers on your designated Care Team:   ?Nicolasa Ducking, NP ?Eula Listen, PA-C ?Cadence Fransico Michael, PA-C ? ? ?Other Instructions ? ?  ? ?Signed, ?Debbe Odea, MD  ?04/19/2021 4:13 PM    ?[facility] ?

## 2021-04-19 NOTE — Patient Instructions (Signed)
Medication Instructions:  ? ?Your physician has recommended you make the following change in your medication:  ? ?START Lipitor 20 MG once a day. ? ?*If you need a refill on your cardiac medications before your next appointment, please call your pharmacy* ? ? ?Lab Work: ? ?Your physician recommends that you return for a FASTING lipid profile: At your earliest convenience ? ?- You will need to be fasting. Please do not have anything to eat or drink after midnight the morning you have the lab work. You may only have water or black coffee with no cream or sugar.  ? ?- Please go to the [facility]. You will check in at the front desk to the right as you walk into the atrium. Valet Parking is offered if needed. ?- No appointment needed. You may go any day between 7 am and 6 pm.  ? ? ? ?Testing/Procedures: ?None ordered ? ? ?Follow-Up: ?At [facility], you and your health needs are our priority.  As part of our continuing mission to provide you with exceptional heart care, we have created designated Provider Care Teams.  These Care Teams include your primary Cardiologist (physician) and Advanced Practice Providers (APPs -  Physician Assistants and Nurse Practitioners) who all work together to provide you with the care you need, when you need it. ? ?We recommend signing up for the patient portal called "MyChart".  Sign up information is provided on this After Visit Summary.  MyChart is used to connect with patients for Virtual Visits (Telemedicine).  Patients are able to view lab/test results, encounter notes, upcoming appointments, etc.  Non-urgent messages can be sent to your provider as well.   ?To learn more about what you can do with MyChart, go to [email or web address].   ? ?Your next appointment:   ?3 month(s) ? ?The format for your next appointment:   ?In Person ? ?Provider:   ?You may see Kate Sable, MD or one of the following Advanced Practice Providers on your designated Care Team:    ?Murray Hodgkins, NP ?Christell Faith, PA-C ?Cadence Kathlen Mody, PA-C ? ? ?Other Instructions ? ? ?

## 2021-04-23 ENCOUNTER — Encounter: Payer: Self-pay | Admitting: Physician Assistant

## 2021-04-23 ENCOUNTER — Other Ambulatory Visit: Payer: Self-pay

## 2021-04-23 ENCOUNTER — Ambulatory Visit (INDEPENDENT_AMBULATORY_CARE_PROVIDER_SITE_OTHER): Payer: Medicare HMO | Admitting: Physician Assistant

## 2021-04-23 VITALS — BP 144/78 | HR 78 | Resp 16 | Wt 204.7 lb

## 2021-04-23 DIAGNOSIS — B009 Herpesviral infection, unspecified: Secondary | ICD-10-CM

## 2021-04-23 DIAGNOSIS — B029 Zoster without complications: Secondary | ICD-10-CM | POA: Diagnosis not present

## 2021-04-23 DIAGNOSIS — M792 Neuralgia and neuritis, unspecified: Secondary | ICD-10-CM | POA: Diagnosis not present

## 2021-04-23 MED ORDER — VALACYCLOVIR HCL 1 G PO TABS: ORAL_TABLET | ORAL | 0 refills | Status: AC

## 2021-04-23 NOTE — Progress Notes (Signed)
?  ? ? ?Established patient visit ? ? ?Patient: Candace Faulkner   DOB: 12/25/1950   71 y.o. Female  MRN: [id number] ?Visit Date: 04/23/2021 ? ?Today's healthcare provider: Mardene Speak, PA-C  ? ?Chief Complaint  ?Patient presents with  ? Rash  ? ?Subjective  ?  ? ?Rash ?This is a new problem. The current episode started in the past 3 -4 days. The affected locations include the left hand and left arm. The rash is characterized by redness, pain and blistering. She was exposed to nothing. Reports that rash was preceded by tingling, itching, pain. Associated symptoms include joint pain, fatigue, headache. Prior to rash, she denies having trauma, infection or surgery except a stressful visit with her grandchildren. Pertinent negatives include no anorexia, congestion, cough, diarrhea, eye pain, facial edema, fatigue, fever, nail changes, rhinorrhea, shortness of breath, sore throat or vomiting. Past treatments include nothing.  ? ?Medications: ?Outpatient Medications Prior to Visit  ?Medication Sig  ? aspirin 81 MG EC tablet Take 1 tablet (81 mg total) by mouth daily. Swallow whole.  ? atorvastatin (LIPITOR) 20 MG tablet Take 1 tablet (20 mg total) by mouth daily.  ? hydrochlorothiazide (HYDRODIURIL) 25 MG tablet Take 1 tablet (25 mg total) by mouth daily.  ? nitroGLYCERIN (NITROSTAT) 0.4 MG SL tablet Place 1 tablet (0.4 mg total) under the tongue every 5 (five) minutes as needed for chest pain. up to 3 tablets in 15 minutes. If you feel the need to take a tablet, please call 911  ? pantoprazole (PROTONIX) 40 MG tablet TAKE 1 TABLET BY MOUTH EVERY DAY  ? Simethicone (GAS RELIEF PO) Take by mouth as needed.  ? venlafaxine (EFFEXOR) 37.5 MG tablet TAKE 1 TABLET BY MOUTH TWICE A DAY  ? ?No facility-administered medications prior to visit.  ? ? ?Review of Systems  ?Constitutional:  Negative for fatigue and fever.  ?HENT:  Negative for congestion, rhinorrhea and sore throat.   ?Eyes:  Negative for pain.  ?Respiratory:  Negative  for cough and shortness of breath.   ?Gastrointestinal:  Negative for diarrhea and vomiting.  ?Skin:  Positive for rash.  ? ?  Objective  ?  ?BP (!) 144/78   Pulse 78   Resp 16   Wt 204 lb 11.2 oz (92.9 kg)   SpO2 97%   BMI 37.44 kg/m?  ? ? ?Physical Exam ?Vitals and nursing note reviewed.  ?Constitutional:   ?   Appearance: Normal appearance.  ?HENT:  ?   Head: Normocephalic and atraumatic.  ?   Right Ear: Ear canal and external ear normal.  ?   Left Ear: Ear canal and external ear normal.  ?   Nose: Nose normal.  ?   Mouth/Throat:  ?   Mouth: Mucous membranes are dry.  ?Eyes:  ?   Extraocular Movements: Extraocular movements intact.  ?   Conjunctiva/sclera: Conjunctivae normal.  ?   Pupils: Pupils are equal, round, and reactive to light.  ?Cardiovascular:  ?   Rate and Rhythm: Normal rate and regular rhythm.  ?   Pulses: Normal pulses.  ?   Heart sounds: Normal heart sounds.  ?Pulmonary:  ?   Effort: Pulmonary effort is normal.  ?   Breath sounds: Normal breath sounds.  ?Abdominal:  ?   General: Abdomen is flat. Bowel sounds are normal.  ?   Palpations: Abdomen is soft.  ?Musculoskeletal:  ?   Cervical back: Normal range of motion. No rigidity.  ?Lymphadenopathy:  ?   Cervical:  No cervical adenopathy.  ?Skin: ?   Findings: Rash present.  ?   Comments: Erythematous grouped vesicles involved left shoulder and left hand.- the C6 dermatome. Slight weakness in distribution of rash. Tenderness on palpation noted.  ?Neurological:  ?   General: No focal deficit present.  ?   Mental Status: She is alert and oriented to person, place, and time.  ?Psychiatric:     ?   Behavior: Behavior normal.     ?   Thought Content: Thought content normal.     ?   Judgment: Judgment normal.  ?  ? ? Assessment & Plan  ?  ? ?1. Herpes zoster  ?Within 72 hours of clinical symptoms, Patient is >55 years of age. Has  not received Zoster vaccine. ?- valACYclovir (VALTREX) 1000 MG tablet; 3 tablets daily for 7 days  Dispense: 20 tablet;  Refill: 0 ? ?2. Acute neuritis ?- valACYclovir (VALTREX) 1000 MG tablet; 3 tablets daily for 7 days  Dispense: 20 tablet; Refill: 0 ?Might consider a 10-day tapering course of prednisone. Patient was counseled to contact [place] if she observes increased erythema, warmth, or purulence surrounding any lesions. ?Will follow-up in 10 days to assess efficacy in relief of symptoms  ? Until the rash has crusted, patient was advised to keep the rash covered, if feasible, and to wash their hands often to prevent the spread of virus to others. ? ?[employer] as a Education administrator for [employer], PA-C.,have documented all relevant documentation on the behalf of Mardene Speak, PA-C,as directed by  [employer], PA-C while in the presence of [employer], PA-C.  ? ?The patient was advised to call back or seek an in-person evaluation if the symptoms worsen or if the condition fails to improve as anticipated. ? ?I discussed the assessment and treatment plan with the patient. The patient was provided an opportunity to ask questions and all were answered. The patient agreed with the plan and demonstrated an understanding of the instructions. ? ?The entirety of the information documented in the History of Present Illness, Review of Systems and Physical Exam were personally obtained by me. Portions of this information were initially documented by the CMA and reviewed by me for thoroughness and accuracy.   ?Mardene Speak, PA-C  ?[place] ?[phone] (phone) ?[phone] (fax) ? ?[facility] ?

## 2021-04-29 ENCOUNTER — Ambulatory Visit: Payer: Medicare HMO | Admitting: Physician Assistant

## 2021-05-02 NOTE — Progress Notes (Deleted)
?  ? ? ?  Established patient visit ? ? ?Patient: Candace Faulkner   DOB: 1950/11/28   71 y.o. Female  MRN: [id number] ?Visit Date: 05/04/2021 ? ?Today's healthcare provider: Debera Lat, PA-C  ? ?No chief complaint on file. ? ?Subjective  ?  ?HPI  ?*** ?1. Herpes zoster  ?Within 72 hours of clinical symptoms, Patient is >72 years of age. Has  not received Zoster vaccine. ?- valACYclovir (VALTREX) 1000 MG tablet; 3 tablets daily for 7 days  Dispense: 20 tablet; Refill: 0 ?  ?2. Acute neuritis ?- valACYclovir (VALTREX) 1000 MG tablet; 3 tablets daily for 7 days  Dispense: 20 tablet; Refill: 0 ?Might consider a 10-day tapering course of prednisone. Patient was counseled to contact [place] if she observes increased erythema, warmth, or purulence surrounding any lesions. ?Will follow-up in 10 days to assess efficacy in relief of symptoms  ? Until the rash has crusted, patient was advised to keep the rash covered, if feasible, and to wash their hands often to prevent the spread of virus to others. ?Medications: ?Outpatient Medications Prior to Visit  ?Medication Sig  ? aspirin 81 MG EC tablet Take 1 tablet (81 mg total) by mouth daily. Swallow whole.  ? atorvastatin (LIPITOR) 20 MG tablet Take 1 tablet (20 mg total) by mouth daily.  ? hydrochlorothiazide (HYDRODIURIL) 25 MG tablet Take 1 tablet (25 mg total) by mouth daily.  ? nitroGLYCERIN (NITROSTAT) 0.4 MG SL tablet Place 1 tablet (0.4 mg total) under the tongue every 5 (five) minutes as needed for chest pain. up to 3 tablets in 15 minutes. If you feel the need to take a tablet, please call 911  ? pantoprazole (PROTONIX) 40 MG tablet TAKE 1 TABLET BY MOUTH EVERY DAY  ? Simethicone (GAS RELIEF PO) Take by mouth as needed.  ? valACYclovir (VALTREX) 1000 MG tablet 3 tablets daily for 7 days  ? venlafaxine (EFFEXOR) 37.5 MG tablet TAKE 1 TABLET BY MOUTH TWICE A DAY  ? ?No facility-administered medications prior to visit.  ? ? ?Review of Systems ? ?{Labs  Heme  Chem  Endocrine   Serology  Results Review (optional):23779} ?  Objective  ?  ?There were no vitals taken for this visit. ?{Show previous vital signs (optional):23777} ? ?Physical Exam  ?*** ? ?No results found for any visits on 05/04/21. ? Assessment & Plan  ?  ? ?*** ? ?No follow-ups on file.  ?   ? ?The patient was advised to call back or seek an in-person evaluation if the symptoms worsen or if the condition fails to improve as anticipated. ? ?I discussed the assessment and treatment plan with the patient. The patient was provided an opportunity to ask questions and all were answered. The patient agreed with the plan and demonstrated an understanding of the instructions. ? ?The entirety of the information documented in the History of Present Illness, Review of Systems and Physical Exam were personally obtained by me. Portions of this information were initially documented by the CMA and reviewed by me for thoroughness and accuracy.   ? ? ?Debera Lat, PA-C  ? Family Practice ?[phone] (phone) ?[phone] (fax) ? ?[facility]  ?

## 2021-05-04 ENCOUNTER — Ambulatory Visit: Payer: Medicare HMO | Admitting: Physician Assistant

## 2021-05-07 ENCOUNTER — Encounter: Payer: Self-pay | Admitting: Physician Assistant

## 2021-05-07 ENCOUNTER — Other Ambulatory Visit: Payer: Self-pay

## 2021-05-07 ENCOUNTER — Ambulatory Visit (INDEPENDENT_AMBULATORY_CARE_PROVIDER_SITE_OTHER): Payer: Medicare HMO | Admitting: Physician Assistant

## 2021-05-07 VITALS — BP 115/61 | HR 83 | Temp 98.6°F | Wt 203.5 lb

## 2021-05-07 DIAGNOSIS — B028 Zoster with other complications: Secondary | ICD-10-CM

## 2021-05-07 NOTE — Progress Notes (Signed)
?  ? ?I,Giliana Vantil Robinson,acting as a Education administrator for [employer], PA-C.,have documented all relevant documentation on the behalf of Mardene Speak, PA-C,as directed by  [employer], PA-C while in the presence of [employer], PA-C.  ? ?Established patient visit ? ? ?Patient: Candace Faulkner   DOB: [birth year]   71 y.o. Female  MRN: [id number] ?Visit Date: 05/07/2021 ? ?Today's healthcare provider: Mardene Speak, PA-C  ? ?Chief Complaint  ?Patient presents with  ? Follow-up  ?  Rash   ? ?Subjective  ?  ?Pt presents for follow up rash.  Reports it's greatly improved.  Patient completed valacyclovir.   ?Patient is requesting Shingles vaccine.  ? ?Medications: ?Outpatient Medications Prior to Visit  ?Medication Sig  ? aspirin 81 MG EC tablet Take 1 tablet (81 mg total) by mouth daily. Swallow whole.  ? atorvastatin (LIPITOR) 20 MG tablet Take 1 tablet (20 mg total) by mouth daily.  ? hydrochlorothiazide (HYDRODIURIL) 25 MG tablet Take 1 tablet (25 mg total) by mouth daily.  ? nitroGLYCERIN (NITROSTAT) 0.4 MG SL tablet Place 1 tablet (0.4 mg total) under the tongue every 5 (five) minutes as needed for chest pain. up to 3 tablets in 15 minutes. If you feel the need to take a tablet, please call 911  ? pantoprazole (PROTONIX) 40 MG tablet TAKE 1 TABLET BY MOUTH EVERY DAY  ? Simethicone (GAS RELIEF PO) Take by mouth as needed.  ? valACYclovir (VALTREX) 1000 MG tablet 3 tablets daily for 7 days  ? venlafaxine (EFFEXOR) 37.5 MG tablet TAKE 1 TABLET BY MOUTH TWICE A DAY  ? ?No facility-administered medications prior to visit.  ? ? ?Review of Systems  ?Constitutional: Negative.   ?Respiratory: Negative.    ?Cardiovascular: Negative.   ?Gastrointestinal: Negative.   ?All other systems reviewed and are negative. ? ?  Objective  ?  ?BP 115/61 (BP Location: Right Arm, Patient Position: Sitting, Cuff Size: Large)   Pulse 83   Temp 98.6 ?F (37 ?C) (Oral)   Wt 203 lb 8 oz (92.3 kg)   SpO2 97%   BMI 37.22 kg/m?  ? ?Physical Exam ?Vitals  reviewed.  ?Constitutional:   ?   Appearance: Normal appearance. She is obese.  ?Cardiovascular:  ?   Pulses: Normal pulses.  ?Pulmonary:  ?   Effort: Pulmonary effort is normal.  ?Skin: ?   General: Skin is warm.  ?Neurological:  ?   General: No focal deficit present.  ?   Mental Status: She is alert and oriented to person, place, and time.  ?Psychiatric:     ?   Mood and Affect: Mood normal.     ?   Behavior: Behavior normal.     ?   Thought Content: Thought content normal.     ?   Judgment: Judgment normal.  ?  ? ? ? Assessment & Plan  ?  ? ?Painful rash due to herpes zoster ?-Resolved. ?-Patient advised to return to her PCP ? ?The patient was advised to call back or seek an in-person evaluation if the symptoms worsen or if the condition fails to improve as anticipated. ? ?I discussed the assessment and treatment plan with the patient. The patient was provided an opportunity to ask questions and all were answered. The patient agreed with the plan and demonstrated an understanding of the instructions. ? ?The entirety of the information documented in the History of Present Illness, Review of Systems and Physical Exam were personally obtained by me. Portions of this information  were initially documented by the CMA and reviewed by me for thoroughness and accuracy.   ? ? ? ?Mardene Speak, PA-C  ?[place] ?[phone] (phone) ?[phone] (fax) ? ?[facility] ?

## 2021-05-28 ENCOUNTER — Ambulatory Visit: Payer: Self-pay

## 2021-05-28 NOTE — Telephone Encounter (Signed)
? ? ? ?  Chief Complaint: Hands are swollen and itchy ?Symptoms: Above ?Frequency: Started yesterday ?Pertinent Negatives: Patient denies pain ?Disposition: [] ED /[x] Urgent Care (no appt availability in office) / [] Appointment(In office/virtual)/ []  [place] [facility]/ [] Home Care/ [] Refused Recommended Disposition /[] [place] [facility]/ []  Follow-up with PCP ?Additional Notes: Pt. Is currently out of town. States she may try Hydrocortisone cream and may go to UC.  ?Answer Assessment - Initial Assessment Questions ?1. ONSET: "When did the swelling start?" (e.g., minutes, hours, days) ?    Yesterday ?2. LOCATION: "What part of the hand is swollen?"  "Are both hands swollen or just one hand?" ?    Both hands ?3. SEVERITY: "How bad is the swelling?" (e.g., localized; mild, moderate, severe) ?  - BALL OR LUMP: small ball or lump ?  - LOCALIZED: puffy or swollen area or patch of skin ?  - JOINT SWELLING: swelling of a joint ?  - MILD: puffiness or mild swelling of fingers or hand ?  - MODERATE: fingers and hand are swollen ?  - SEVERE: swelling of entire hand and up into forearm ?    Hand and fingers ?4. REDNESS: "Does the swelling look red or infected?" ?    Red ?5. PAIN: "Is the swelling painful to touch?" If Yes, ask: "How painful is it?"   (Scale 1-10; mild, moderate or severe) ?    No ?6. FEVER: "Do you have a fever?" If Yes, ask: "What is it, how was it measured, and when did it start?"  ?    No ?7. CAUSE: "What do you think is causing the hand swelling?" (e.g., heat, insect bite, pregnancy, recent injury) ?    Unsure ?8. MEDICAL HISTORY: "Do you have a history of heart failure, kidney disease, liver failure, or cancer?" ?    No ?9. RECURRENT SYMPTOM: "Have you had hand swelling before?" If Yes, ask: "When was the last time?" "What happened that time?" ?    Yes ?10. OTHER SYMPTOMS: "Do you have any other symptoms?" (e.g., blurred vision, difficulty breathing, headache) ?      No ?11. PREGNANCY: "Is there  any chance you are pregnant?" "When was your last menstrual period?" ?      No ? ?Protocols used: Hand Swelling-A-AH ? ?

## 2021-06-28 ENCOUNTER — Telehealth: Payer: Self-pay | Admitting: Family Medicine

## 2021-06-28 NOTE — Telephone Encounter (Signed)
Copied from CRM [id number]. Topic: Medicare AWV ?>> Jun 28, 2021 12:22 PM Claudette Laws R wrote: ?Reason for CRM:  ?Left message for patient to call back and schedule Medicare Annual Wellness Visit (AWV) in office.  ? ?If not able to come in the office, please offer to do virtually or by telephone.  ? ?No hx of AWV - AWV-I eligible per [employer] as of  01/14/2018 ? ?Please schedule at anytime with [facility] Health Advisor.  ? ?45 minute appointment ? ?Any questions, please contact me at [phone] ?

## 2021-07-05 ENCOUNTER — Other Ambulatory Visit: Payer: Self-pay | Admitting: Family Medicine

## 2021-07-05 MED ORDER — ATORVASTATIN CALCIUM 20 MG PO TABS: 20.0000 mg | ORAL_TABLET | Freq: Every day | ORAL | 0 refills | Status: DC

## 2021-07-05 MED ORDER — HYDROCHLOROTHIAZIDE 25 MG PO TABS: 25.0000 mg | ORAL_TABLET | Freq: Every day | ORAL | 0 refills | Status: DC

## 2021-07-05 MED ORDER — PANTOPRAZOLE SODIUM 40 MG PO TBEC: 40.0000 mg | DELAYED_RELEASE_TABLET | Freq: Every day | ORAL | 0 refills | Status: DC

## 2021-07-05 NOTE — Telephone Encounter (Signed)
Requested medication (s) are due for refill today: Yes  Requested medication (s) are on the active medication list: Yes  Last refill:  2 months ago  Future visit scheduled: Yes  Notes to clinic:  Unable to refill per protocol due to failed labs, no updated results, last refilled by another provider.     Requested Prescriptions  Pending Prescriptions Disp Refills   atorvastatin (LIPITOR) 20 MG tablet 30 tablet 5    Sig: Take 1 tablet (20 mg total) by mouth daily.     Cardiovascular:  Antilipid - Statins Failed - 07/05/2021  4:36 PM      Failed - Lipid Panel in normal range within the last 12 months    Cholesterol  Date Value Ref Range Status  07/29/2019 144 0 - 200 Final   LDL Cholesterol  Date Value Ref Range Status  07/29/2019 79  Final   HDL  Date Value Ref Range Status  07/29/2019 40 35 - 70 Final   Triglycerides  Date Value Ref Range Status  07/29/2019 82 40 - 160 Final         Passed - Patient is not pregnant      Passed - Valid encounter within last 12 months    Recent Outpatient Visits           1 month ago Herpes zoster with complication   [facility] [place], [place], PA-C   2 months ago Herpes zoster without complication   [employer], [place], PA-C   3 months ago Chest pain, unspecified type   [facility] Thedore Mins, [place], PA-C   3 months ago Erroneous encounter - disregard   [facility] [place], Dionne Bucy, MD   7 months ago Primary hypertension   [facility] [place], Dionne Bucy, MD       Future Appointments             In 3 weeks Agbor-Etang, Aaron Edelman, MD [facility]   In 4 weeks Bacigalupo, Dionne Bucy, MD [facility], [facility]             Signed Prescriptions Disp Refills   hydrochlorothiazide (HYDRODIURIL) 25 MG tablet 90 tablet 0    Sig: Take 1 tablet (25 mg total) by mouth daily.     Cardiovascular: Diuretics -  Thiazide Failed - 07/05/2021  4:36 PM      Failed - Cr in normal range and within 180 days    Creatinine, Ser  Date Value Ref Range Status  03/12/2021 1.11 (H) 0.44 - 1.00 mg/dL Final         Passed - K in normal range and within 180 days    Potassium  Date Value Ref Range Status  03/12/2021 4.0 3.5 - 5.1 mmol/L Final         Passed - Na in normal range and within 180 days    Sodium  Date Value Ref Range Status  03/12/2021 141 135 - 145 mmol/L Final  12/03/2020 144 134 - 144 mmol/L Final         Passed - Last BP in normal range    BP Readings from Last 1 Encounters:  05/07/21 115/61         Passed - Valid encounter within last 6 months    Recent Outpatient Visits           1 month ago Herpes zoster with complication   [place], [place], PA-C   2 months  ago Herpes zoster without complication   [facility], [place], PA-C   3 months ago Chest pain, unspecified type   [facility] Thedore Mins, [place], PA-C   3 months ago Erroneous encounter - disregard   [facility] [place], Dionne Bucy, MD   7 months ago Primary hypertension   [facility], Dionne Bucy, MD       Future Appointments             In 3 weeks Agbor-Etang, Aaron Edelman, MD [facility], [facility]   In 4 weeks Bacigalupo, Dionne Bucy, MD [facility], [facility]              pantoprazole (PROTONIX) 40 MG tablet 90 tablet 0    Sig: Take 1 tablet (40 mg total) by mouth daily.     Gastroenterology: Proton Pump Inhibitors Passed - 07/05/2021  4:36 PM      Passed - Valid encounter within last 12 months    Recent Outpatient Visits           1 month ago Herpes zoster with complication   [facility] [place], [place], PA-C   2 months ago Herpes zoster without complication   [facility] [place], [place], PA-C   3 months ago Chest pain, unspecified type   [facility] Mikey Kirschner, PA-C   3 months ago Erroneous encounter - disregard   [facility] [place], Dionne Bucy, MD   7 months ago Primary hypertension   [facility], Dionne Bucy, MD       Future Appointments             In 3 weeks Agbor-Etang, Aaron Edelman, MD [facility], [facility]   In 4 weeks Bacigalupo, Dionne Bucy, MD [facility] [place]

## 2021-07-05 NOTE — Telephone Encounter (Signed)
[facility] [place], [place] called and asked if the Atorvastin could be pulled over. She says there is nothing showing on file for this patient, so it's best to just send over the Rx to them.

## 2021-07-05 NOTE — Telephone Encounter (Signed)
Patient called, left VM to return the call to the office. Let patient know if she returns the call that Atorvastatin prescribed by cardiology, will need to request the refill from that office.

## 2021-07-05 NOTE — Telephone Encounter (Signed)
Requested Prescriptions  Pending Prescriptions Disp Refills  . hydrochlorothiazide (HYDRODIURIL) 25 MG tablet 90 tablet 0    Sig: Take 1 tablet (25 mg total) by mouth daily.     Cardiovascular: Diuretics - Thiazide Failed - 07/05/2021  4:36 PM      Failed - Cr in normal range and within 180 days    Creatinine, Ser  Date Value Ref Range Status  03/12/2021 1.11 (H) 0.44 - 1.00 mg/dL Final         Passed - K in normal range and within 180 days    Potassium  Date Value Ref Range Status  03/12/2021 4.0 3.5 - 5.1 mmol/L Final         Passed - Na in normal range and within 180 days    Sodium  Date Value Ref Range Status  03/12/2021 141 135 - 145 mmol/L Final  12/03/2020 144 134 - 144 mmol/L Final         Passed - Last BP in normal range    BP Readings from Last 1 Encounters:  05/07/21 115/61         Passed - Valid encounter within last 6 months    Recent Outpatient Visits          1 month ago Herpes zoster with complication   [facility] [place], [place], PA-C   2 months ago Herpes zoster without complication   [employer], [place], PA-C   3 months ago Chest pain, unspecified type   [employer], [place], PA-C   3 months ago Erroneous encounter - disregard   [facility] [place], Marzella Schlein, MD   7 months ago Primary hypertension   [facility] Bacigalupo, Marzella Schlein, MD      Future Appointments            In 3 weeks Agbor-Etang, Arlys John, MD [facility], [facility]   In 4 weeks Bacigalupo, Marzella Schlein, MD [facility], [facility]           . pantoprazole (PROTONIX) 40 MG tablet 90 tablet 0    Sig: Take 1 tablet (40 mg total) by mouth daily.     Gastroenterology: Proton Pump Inhibitors Passed - 07/05/2021  4:36 PM      Passed - Valid encounter within last 12 months    Recent Outpatient Visits          1 month ago Herpes zoster with complication   [facility] [place], [place], PA-C   2 months ago Herpes zoster without complication   [facility] [place], [place], PA-C   3 months ago Chest pain, unspecified type   [facility] Alfredia Ferguson, PA-C   3 months ago Erroneous encounter - disregard   [facility] [place], Marzella Schlein, MD   7 months ago Primary hypertension   [facility] Bacigalupo, Marzella Schlein, MD      Future Appointments            In 3 weeks Agbor-Etang, Arlys John, MD [facility], [facility]   In 4 weeks Bacigalupo, Marzella Schlein, MD [facility], [facility]           . atorvastatin (LIPITOR) 20 MG tablet 30 tablet 5    Sig: Take 1 tablet (20 mg total) by mouth daily.     Cardiovascular:  Antilipid - Statins Failed - 07/05/2021  4:36 PM      Failed - Lipid Panel in normal range within  the last 12 months    Cholesterol  Date Value Ref Range Status  07/29/2019 144 0 - 200 Final   LDL Cholesterol  Date Value Ref Range Status  07/29/2019 79  Final   HDL  Date Value Ref Range Status  07/29/2019 40 35 - 70 Final   Triglycerides  Date Value Ref Range Status  07/29/2019 82 40 - 160 Final         Passed - Patient is not pregnant      Passed - Valid encounter within last 12 months    Recent Outpatient Visits          1 month ago Herpes zoster with complication   [facility] [place], [place], PA-C   2 months ago Herpes zoster without complication   [employer], [place], PA-C   3 months ago Chest pain, unspecified type   [employer], [place], PA-C   3 months ago Erroneous encounter - disregard   [facility] [place], Marzella Schlein, MD   7 months ago Primary hypertension   [facility] Bacigalupo, Marzella Schlein, MD      Future Appointments            In 3 weeks Agbor-Etang, Arlys John, MD [facility], [facility]   In 4 weeks Bacigalupo, Marzella Schlein, MD  [facility], [facility]

## 2021-07-05 NOTE — Telephone Encounter (Signed)
Medication Refill - Medication:   Pt stated she missed placed her medication and she needs refills for cholesterol, heartburn, and bp. Pt stated one has two little pink pills. Pt is unsure of the names of medications.  Pt stated she had a week left of medications, but she has missed placed them and has not taken any medication since Saturday. Pt is requesting a refill of medications for at least a week.   Has the patient contacted their pharmacy? No. (Agent: If no, request that the patient contact the pharmacy for the refill. If patient does not wish to contact the pharmacy document the reason why and proceed with request.)   Preferred Pharmacy (with phone number or street name):  [id number] [place], [place] - [address] AT [facility] [address]  [address] [place] [address]  Phone: [phone] Fax: [phone]  Hours: Open 24 hours   Has the patient been seen for an appointment in the last year OR does the patient have an upcoming appointment? Yes.    Agent: Please be advised that RX refills may take up to 3 business days. We ask that you follow-up with your pharmacy.

## 2021-07-26 ENCOUNTER — Ambulatory Visit: Payer: Medicare HMO | Admitting: Cardiology

## 2021-07-26 ENCOUNTER — Encounter: Payer: Self-pay | Admitting: Cardiology

## 2021-07-30 NOTE — Progress Notes (Deleted)
Annual Wellness Visit     Patient: Candace Faulkner, Female    DOB: [birth year], 71 y.o.   MRN: [id number] Visit Date: 08/02/2021  Today's Provider: Shirlee Latch, MD   No chief complaint on file.  Subjective    Candace Faulkner is a 71 y.o. female who presents today for her Annual Wellness Visit. She reports consuming a {diet types:17450} diet. {Exercise:19826} She generally feels {well/fairly well/poorly:18703}. She reports sleeping {well/fairly well/poorly:18703}. She {does/does not:200015} have additional problems to discuss today.   HPI    Medications: Outpatient Medications Prior to Visit  Medication Sig   aspirin 81 MG EC tablet Take 1 tablet (81 mg total) by mouth daily. Swallow whole.   atorvastatin (LIPITOR) 20 MG tablet Take 1 tablet (20 mg total) by mouth daily.   hydrochlorothiazide (HYDRODIURIL) 25 MG tablet Take 1 tablet (25 mg total) by mouth daily.   nitroGLYCERIN (NITROSTAT) 0.4 MG SL tablet Place 1 tablet (0.4 mg total) under the tongue every 5 (five) minutes as needed for chest pain. up to 3 tablets in 15 minutes. If you feel the need to take a tablet, please call 911   pantoprazole (PROTONIX) 40 MG tablet Take 1 tablet (40 mg total) by mouth daily.   Simethicone (GAS RELIEF PO) Take by mouth as needed.   valACYclovir (VALTREX) 1000 MG tablet 3 tablets daily for 7 days   venlafaxine (EFFEXOR) 37.5 MG tablet TAKE 1 TABLET BY MOUTH TWICE A DAY   No facility-administered medications prior to visit.    Allergies  Allergen Reactions   Latex     Patient Care Team: Erasmo Downer, MD as PCP - General (Family Medicine) Debbe Odea, MD as PCP - Cardiology (Cardiology)  Review of Systems  {Labs  Heme  Chem  Endocrine  Serology  Results Review (optional):23779}    Objective    Vitals: There were no vitals taken for this visit. {Show previous vital signs (optional):23777}   Physical Exam ***  Most recent functional status assessment:     03/10/2021   11:16 AM  In your present state of health, do you have any difficulty performing the following activities:  Hearing? 0  Vision? 0  Difficulty concentrating or making decisions? 0  Walking or climbing stairs? 0  Dressing or bathing? 0  Doing errands, shopping? 0   Most recent fall risk assessment:    03/10/2021   11:15 AM  Fall Risk   Falls in the past year? 0  Number falls in past yr: 0  Injury with Fall? 0  Risk for fall due to : No Fall Risks  Follow up Falls evaluation completed    Most recent depression screenings:    03/10/2021   11:16 AM 01/24/2020    8:40 AM  PHQ 2/9 Scores  PHQ - 2 Score 1 0  PHQ- 9 Score 7 3   Most recent cognitive screening:     No data to display         Most recent Audit-C alcohol use screening    03/10/2021   11:16 AM  Alcohol Use Disorder Test (AUDIT)  1. How often do you have a drink containing alcohol? 0  2. How many drinks containing alcohol do you have on a typical day when you are drinking? 0  3. How often do you have six or more drinks on one occasion? 0  AUDIT-C Score 0   A score of 3 or more in women, and 4 or more in men  indicates increased risk for alcohol abuse, EXCEPT if all of the points are from question 1   No results found for any visits on 08/02/21.  Assessment & Plan     Annual wellness visit done today including the all of the following: Reviewed patient's Family Medical History Reviewed and updated list of patient's medical providers Assessment of cognitive impairment was done Assessed patient's functional ability Established a written schedule for health screening services Health Risk Assessent Completed and Reviewed  Exercise Activities and Dietary recommendations  Goals   None     Immunization History  Administered Date(s) Administered   Moderna Sars-Covid-2 Vaccination 09/13/2019, 10/11/2019   Pneumococcal Conjugate-13 10/27/2017   Pneumococcal Polysaccharide-23 02/25/2019     Health Maintenance  Topic Date Due   TETANUS/TDAP  Never done   COLONOSCOPY (Pts 45-98yrs Insurance coverage will need to be confirmed)  Never done   Zoster Vaccines- Shingrix (1 of 2) Never done   MAMMOGRAM  09/17/2019   COVID-19 Vaccine (3 - Moderna series) 12/06/2019   INFLUENZA VACCINE  09/14/2021   Pneumonia Vaccine 64+ Years old  Completed   DEXA SCAN  Completed   Hepatitis C Screening  Completed   HPV VACCINES  Aged Out     Discussed health benefits of physical activity, and encouraged her to engage in regular exercise appropriate for her age and condition.    ***  No follow-ups on file.     {provider attestation***:1}   Shirlee Latch, MD  [facility] [phone] (phone) [phone] (fax)  [facility]

## 2021-08-02 ENCOUNTER — Encounter: Payer: Medicare HMO | Admitting: Family Medicine

## 2021-08-12 ENCOUNTER — Telehealth: Payer: Self-pay | Admitting: Family Medicine

## 2021-08-12 NOTE — Telephone Encounter (Signed)
Copied from CRM [id number]. Topic: Medicare AWV >> Aug 12, 2021  1:22 PM Zannie Kehr wrote: Reason for CRM:  Left message for patient to call back and schedule Medicare Annual Wellness Visit (AWV) in office.   If not able to come in the office, please offer to do virtually or by telephone.   No hx of AWV - AWV-I eligible per [employer] as of  01/14/2018  Please schedule at anytime with [facility] Health Advisor.   45 minute appointment  Any questions, please contact me at [phone]

## 2021-08-31 ENCOUNTER — Other Ambulatory Visit: Payer: Self-pay | Admitting: Family Medicine

## 2021-08-31 NOTE — Telephone Encounter (Signed)
[place] Atorvastatin 20mg ..  [facility] [address] [facility] [place]  CB#[phone]

## 2021-09-01 NOTE — Telephone Encounter (Signed)
Requested medication (s) are due for refill today: yes  Requested medication (s) are on the active medication list: yes  Last refill:  07/05/21 #30  Future visit scheduled: no   Notes to clinic:  Called pt and LM on VM to call office for appt (overdue labs > 1 yr overdue)- last refill was a #30 day courtesy refill   Requested Prescriptions  Pending Prescriptions Disp Refills   atorvastatin (LIPITOR) 20 MG tablet 30 tablet 0    Sig: Take 1 tablet (20 mg total) by mouth daily.     Cardiovascular:  Antilipid - Statins Failed - 08/31/2021  5:06 PM      Failed - Lipid Panel in normal range within the last 12 months    Cholesterol  Date Value Ref Range Status  07/29/2019 144 0 - 200 Final   LDL Cholesterol  Date Value Ref Range Status  07/29/2019 79  Final   HDL  Date Value Ref Range Status  07/29/2019 40 35 - 70 Final   Triglycerides  Date Value Ref Range Status  07/29/2019 82 40 - 160 Final         Passed - Patient is not pregnant      Passed - Valid encounter within last 12 months    Recent Outpatient Visits           3 months ago Herpes zoster with complication   [facility] [place], [place], PA-C   4 months ago Herpes zoster without complication   [employer], [place], PA-C   5 months ago Chest pain, unspecified type   [employer], [place], PA-C   5 months ago Erroneous encounter - disregard   [facility] [place], Marzella Schlein, MD   9 months ago Primary hypertension   [facility], Marzella Schlein, MD

## 2021-09-13 ENCOUNTER — Telehealth: Payer: Self-pay | Admitting: Family Medicine

## 2021-09-13 NOTE — Telephone Encounter (Signed)
Copied from CRM [id number]. Topic: Medicare AWV >> Sep 13, 2021  3:31 PM Zannie Kehr wrote: Reason for CRM:  Left message for patient to call back and schedule Medicare Annual Wellness Visit (AWV) in office.   If not able to come in the office, please offer to do virtually or by telephone.   No hx of AWV - AWV-I eligible per [employer] as of 01/14/2018  Please schedule at anytime with [facility] Health Advisor.   45 minute appointment  Any questions, please contact me at [phone]

## 2021-09-25 ENCOUNTER — Other Ambulatory Visit: Payer: Self-pay | Admitting: Family Medicine

## 2021-09-27 NOTE — Telephone Encounter (Signed)
Requested Prescriptions  Pending Prescriptions Disp Refills  . pantoprazole (PROTONIX) 40 MG tablet [Pharmacy Med Name: PANTOPRAZOLE SOD DR 40 MG TAB] 90 tablet 0    Sig: TAKE 1 TABLET BY MOUTH EVERY DAY     Gastroenterology: Proton Pump Inhibitors Passed - 09/25/2021  9:30 AM      Passed - Valid encounter within last 12 months    Recent Outpatient Visits          4 months ago Herpes zoster with complication   [facility] [place], [place], PA-C   5 months ago Herpes zoster without complication   [facility] [place], [place], PA-C   6 months ago Chest pain, unspecified type   [facility] [place], [place], PA-C   6 months ago Erroneous encounter - disregard   [facility] [place], Marzella Schlein, MD   9 months ago Primary hypertension   [facility] [place], Marzella Schlein, MD             . hydrochlorothiazide (HYDRODIURIL) 25 MG tablet [Pharmacy Med Name: HYDROCHLOROTHIAZIDE 25 MG TAB] 90 tablet 0    Sig: TAKE 1 TABLET (25 MG TOTAL) BY MOUTH DAILY.     Cardiovascular: Diuretics - Thiazide Failed - 09/25/2021  9:30 AM      Failed - Cr in normal range and within 180 days    Creatinine, Ser  Date Value Ref Range Status  03/12/2021 1.11 (H) 0.44 - 1.00 mg/dL Final         Failed - K in normal range and within 180 days    Potassium  Date Value Ref Range Status  03/12/2021 4.0 3.5 - 5.1 mmol/L Final         Failed - Na in normal range and within 180 days    Sodium  Date Value Ref Range Status  03/12/2021 141 135 - 145 mmol/L Final  12/03/2020 144 134 - 144 mmol/L Final         Passed - Last BP in normal range    BP Readings from Last 1 Encounters:  05/07/21 115/61         Passed - Valid encounter within last 6 months    Recent Outpatient Visits          4 months ago Herpes zoster with complication   [facility] [place], [place], PA-C   5 months ago Herpes zoster without complication    [facility] [place], [place], PA-C   6 months ago Chest pain, unspecified type   [facility] [place], [place], PA-C   6 months ago Erroneous encounter - disregard   [facility] [place], Marzella Schlein, MD   9 months ago Primary hypertension   [facility] [place], Marzella Schlein, MD

## 2021-11-23 ENCOUNTER — Other Ambulatory Visit: Payer: Self-pay

## 2021-11-23 MED ORDER — ATORVASTATIN CALCIUM 20 MG PO TABS
20.0000 mg | ORAL_TABLET | Freq: Every day | ORAL | 1 refills | Status: DC
Start: 2021-11-23 — End: 2021-12-09

## 2021-12-06 ENCOUNTER — Other Ambulatory Visit: Payer: Self-pay | Admitting: Family Medicine

## 2021-12-07 NOTE — Addendum Note (Signed)
Addended by: Matilde Sprang on: 12/07/2021 04:29 PM   Modules accepted: Orders

## 2021-12-07 NOTE — Telephone Encounter (Signed)
Pt called in following up on refill for Atorvastain. Pt says Rx was written by previous provider. Pt say that she is still out of town [facility]) pt is requesting to have Rx sent to   [id number] [place] [place] Phone:  [phone]  Fax:  [phone]      Last ov: 05/07/21

## 2021-12-07 NOTE — Telephone Encounter (Signed)
   Notes to clinic:  please assess situation.      Requested Prescriptions  Pending Prescriptions Disp Refills   atorvastatin (LIPITOR) 20 MG tablet 30 tablet 1    Sig: Take 1 tablet (20 mg total) by mouth daily. Please schedule appointment for further refills     There is no refill protocol information for this order    Signed Prescriptions Disp Refills   atorvastatin (LIPITOR) 20 MG tablet 30 tablet 1    Sig: Take 1 tablet (20 mg total) by mouth daily. Please schedule appointment for further refills     There is no refill protocol information for this order

## 2021-12-09 ENCOUNTER — Telehealth: Payer: Self-pay | Admitting: Cardiology

## 2021-12-09 MED ORDER — ATORVASTATIN CALCIUM 20 MG PO TABS: 20.0000 mg | ORAL_TABLET | Freq: Every day | ORAL | 0 refills | Status: DC

## 2021-12-09 NOTE — Telephone Encounter (Signed)
*  STAT* If patient is at the pharmacy, call can be transferred to refill team.   1. Which medications need to be refilled? (please list name of each medication and dose if known) atorvastatin (LIPITOR) 20 MG tablet  2. Which pharmacy/location (including street and city if local pharmacy) is medication to be sent to?   [facility]  [place] [place] [address]  [phone]   3. Do they need a 30 day or 90 day supply? 59  PT IS COMPLETELY OUT AND IN [place], SHE ASK THAT YOU SEND TO PHARMACY ABOVE. PT MADE AN APPT FOR 01/27/22

## 2021-12-09 NOTE — Telephone Encounter (Signed)
Requested Prescriptions   Signed Prescriptions Disp Refills   atorvastatin (LIPITOR) 20 MG tablet 90 tablet 0    Sig: Take 1 tablet (20 mg total) by mouth daily.    Authorizing Provider: Kate Sable    Ordering User: Britt Bottom

## 2021-12-18 ENCOUNTER — Other Ambulatory Visit: Payer: Self-pay | Admitting: Family Medicine

## 2021-12-20 NOTE — Telephone Encounter (Signed)
Requested Prescriptions  Pending Prescriptions Disp Refills   pantoprazole (PROTONIX) 40 MG tablet [Pharmacy Med Name: PANTOPRAZOLE SOD DR 40 MG TAB] 90 tablet 1    Sig: TAKE 1 TABLET BY MOUTH EVERY DAY     Gastroenterology: Proton Pump Inhibitors Passed - 12/18/2021  9:30 AM      Passed - Valid encounter within last 12 months    Recent Outpatient Visits           7 months ago Herpes zoster with complication   [facility] [place], [place], PA-C   8 months ago Herpes zoster without complication   [facility] [place], [place], PA-C   9 months ago Chest pain, unspecified type   [facility] Thed[place] Mins, [place], PA-C   9 months ago Erroneous encounter - disregard   [facility] [place], Dionne Bucy, MD   1 year ago Primary hypertension   [place], Dionne Bucy, MD       Future Appointments             In 1 month Agbor-Etang, Aaron Edelman, MD [place]             atorvastatin (LIPITOR) 20 MG tablet [Pharmacy Med Name: ATORVASTATIN 20 MG TABLET] 30 tablet     Sig: TAKE 1 TABLET BY MOUTH EVERY DAY     Cardiovascular:  Antilipid - Statins Failed - 12/18/2021  9:30 AM      Failed - Lipid Panel in normal range within the last 12 months    Cholesterol  Date Value Ref Range Status  07/29/2019 144 0 - 200 Final   LDL Cholesterol  Date Value Ref Range Status  07/29/2019 79  Final   HDL  Date Value Ref Range Status  07/29/2019 40 35 - 70 Final   Triglycerides  Date Value Ref Range Status  07/29/2019 82 40 - 160 Final         Passed - Patient is not pregnant      Passed - Valid encounter within last 12 months    Recent Outpatient Visits           7 months ago Herpes zoster with complication   [facility] [place], [place], PA-C   8 months ago Herpes zoster without complication   [employer], [place], PA-C   9  months ago Chest pain, unspecified type   [facility] Thedore Mins, [place], PA-C   9 months ago Erroneous encounter - disregard   [facility] [place], Dionne Bucy, MD   1 year ago Primary hypertension   [place], Dionne Bucy, MD       Future Appointments             In 1 month Agbor-Etang, Aaron Edelman, MD [place]. [place]

## 2021-12-29 ENCOUNTER — Telehealth: Payer: Self-pay | Admitting: Family Medicine

## 2021-12-29 ENCOUNTER — Other Ambulatory Visit: Payer: Self-pay | Admitting: Family Medicine

## 2021-12-29 NOTE — Telephone Encounter (Signed)
Requested Prescriptions  Pending Prescriptions Disp Refills   hydrochlorothiazide (HYDRODIURIL) 25 MG tablet [Pharmacy Med Name: HYDROCHLOROTHIAZIDE 25 MG TAB] 90 tablet 3    Sig: TAKE 1 TABLET (25 MG TOTAL) BY MOUTH DAILY.     Cardiovascular: Diuretics - Thiazide Failed - 12/29/2021  9:38 AM      Failed - Cr in normal range and within 180 days    Creatinine, Ser  Date Value Ref Range Status  03/12/2021 1.11 (H) 0.44 - 1.00 mg/dL Final         Failed - K in normal range and within 180 days    Potassium  Date Value Ref Range Status  03/12/2021 4.0 3.5 - 5.1 mmol/L Final         Failed - Na in normal range and within 180 days    Sodium  Date Value Ref Range Status  03/12/2021 141 135 - 145 mmol/L Final  12/03/2020 144 134 - 144 mmol/L Final         Failed - Valid encounter within last 6 months    Recent Outpatient Visits           7 months ago Herpes zoster with complication   [facility] [place], [place], PA-C   8 months ago Herpes zoster without complication   [facility] [place], [place], PA-C   9 months ago Chest pain, unspecified type   [facility] [place], [place], PA-C   9 months ago Erroneous encounter - disregard   [facility] [place], Marzella Schlein, MD   1 year ago Primary hypertension   [facility] [place], Marzella Schlein, MD       Future Appointments             In 1 month Agbor-Etang, Arlys John, MD [facility] [place]            Passed - Last BP in normal range    BP Readings from Last 1 Encounters:  05/07/21 115/61

## 2021-12-29 NOTE — Telephone Encounter (Signed)
Copied from CRM [id number]. Topic: General - Other >> Dec 29, 2021 10:44 AM Everette C wrote: Reason for CRM: Medication Refill - Medication: isosorbide mononitrate (IMDUR) 30 MG 24 hr tablet [[id number]] - patient has1 tablet remaining   Has the patient contacted their pharmacy? Yes.  The patient has been directed to contact their PCP The patient will be traveling to [place] tomorrow for a funeral  (Agent: If no, request that the patient contact the pharmacy for the refill. If patient does not wish to contact the pharmacy document the reason why and proceed with request.) (Agent: If yes, when and what did the pharmacy advise?)  Preferred Pharmacy (with phone number or street name): [id number] [place], [place] - [address] AT [facility] [address] [place] [address] Phone: [phone] Fax: [phone] Hours: Open 24 hours   Has the patient been seen for an appointment in the last year OR does the patient have an upcoming appointment? Yes.    Agent: Please be advised that RX refills may take up to 3 business days. We ask that you follow-up with your pharmacy.

## 2021-12-29 NOTE — Telephone Encounter (Signed)
Medication is not on current list, routing for review.

## 2021-12-29 NOTE — Telephone Encounter (Signed)
This medication is prescribed and managed by cardiologist Dr. Myriam Forehand. Patient notified. Patient plans to contact cardiologist for refills.

## 2021-12-30 ENCOUNTER — Other Ambulatory Visit: Payer: Self-pay

## 2021-12-30 MED ORDER — ATORVASTATIN CALCIUM 20 MG PO TABS: 20.0000 mg | ORAL_TABLET | Freq: Every day | ORAL | 0 refills | Status: AC

## 2021-12-30 MED ORDER — ATORVASTATIN CALCIUM 20 MG PO TABS: 20.0000 mg | ORAL_TABLET | Freq: Every day | ORAL | 0 refills | Status: DC

## 2022-01-20 ENCOUNTER — Telehealth: Payer: Self-pay | Admitting: *Deleted

## 2022-01-20 NOTE — Telephone Encounter (Signed)
Lmovm to verify if pt is taking Isosorbide Mono. 30 mg tablet. 1/2 tablet by mouth daily. [facility]. Medication not on pt's current medlist.

## 2022-01-28 ENCOUNTER — Ambulatory Visit: Payer: Medicare HMO | Attending: Cardiology | Admitting: Cardiology

## 2022-01-31 ENCOUNTER — Encounter: Payer: Self-pay | Admitting: Cardiology

## 2022-02-08 ENCOUNTER — Telehealth: Payer: Self-pay | Admitting: Family Medicine

## 2022-02-08 NOTE — Telephone Encounter (Signed)
Transition Care Management Unsuccessful Follow-up Telephone Call  Date of discharge and from where:  02/04/22 [facility]  Attempts:  1st Attempt  Reason for unsuccessful TCM follow-up call:  Left voice message

## 2022-02-21 ENCOUNTER — Telehealth: Payer: Self-pay | Admitting: Family Medicine

## 2022-02-21 NOTE — Telephone Encounter (Signed)
Left message for patient to call back and schedule Medicare Annual Wellness Visit (AWV) in office.   If not able to come in office, please offer to do virtually or by telephone.  Left office number and my jabber [phone].  AWVI eligible as of 01/14/2018  Please schedule at anytime with Nurse Health Advisor.

## 2022-04-19 ENCOUNTER — Other Ambulatory Visit: Payer: Self-pay | Admitting: Family Medicine

## 2022-05-11 ENCOUNTER — Other Ambulatory Visit: Payer: Self-pay | Admitting: Family Medicine

## 2022-06-08 ENCOUNTER — Telehealth: Payer: Self-pay | Admitting: Family Medicine

## 2022-06-08 NOTE — Telephone Encounter (Signed)
Copied from CRM [id number]. Topic: Medicare AWV >> Jun 08, 2022  2:54 PM Rushie Goltz wrote: Reason for CRM: Called patient to schedule Medicare Annual Wellness Visit (AWV). Left message for patient to call back and schedule Medicare Annual Wellness Visit (AWV).  Last date of AWV: AWVI  eligible as of 01/14/2018  Please schedule an AWVI appointment at any time with [facility] ANNUAL WELLNESS VISIT.  If any questions, please contact me at [phone].    Thank you,  [facility] Direct dial  [phone]

## 2023-03-02 IMAGING — US US ABDOMEN COMPLETE
1 series · 14 of 25 positions shown · non-contrast
Comparison: None.

CLINICAL DATA: Lower abdominal distension.

EXAM:
ABDOMEN ULTRASOUND COMPLETE

[Series 1: us abdomen complete · 14 of 179 slices shown]
[im 1/179]
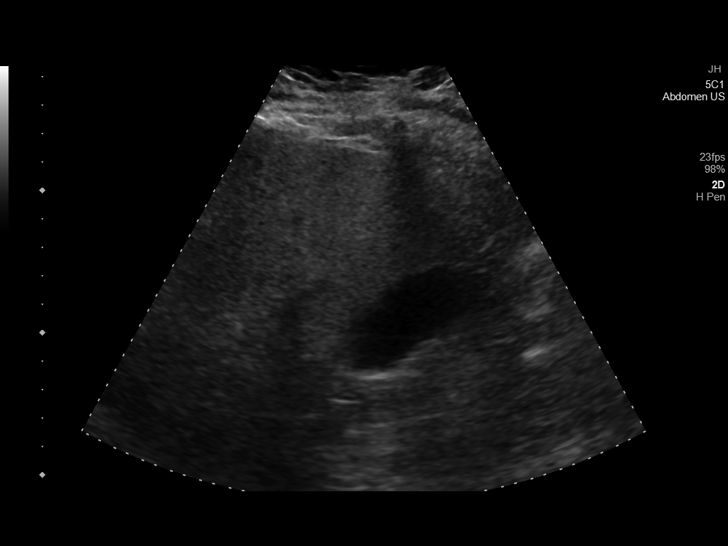
[im 15/179]
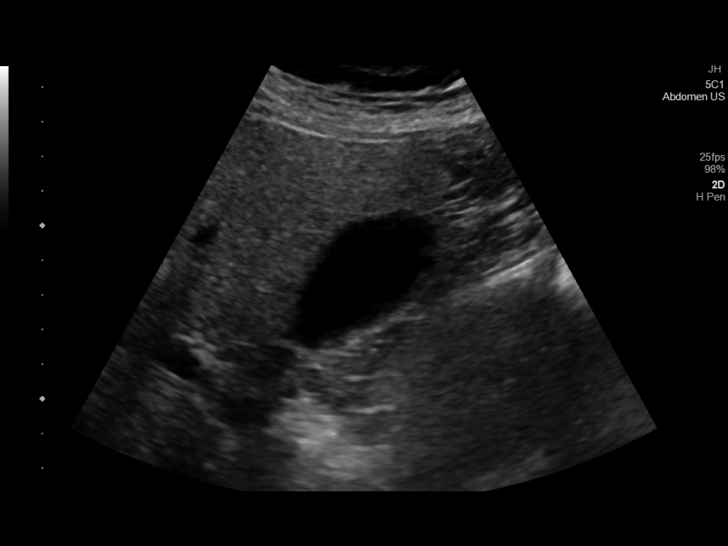
[im 30/179]
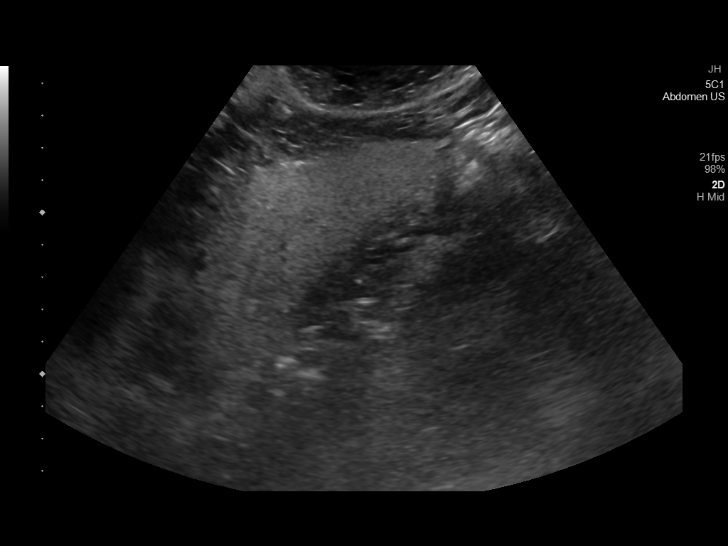
[im 45/179]
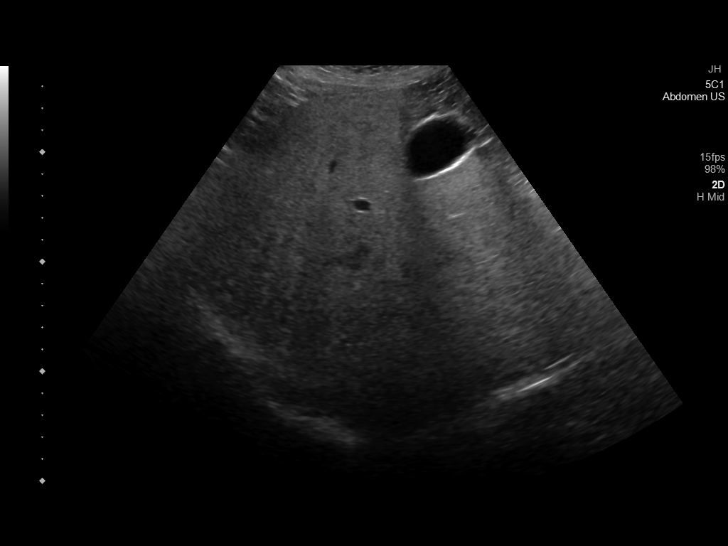
[im 60/179]
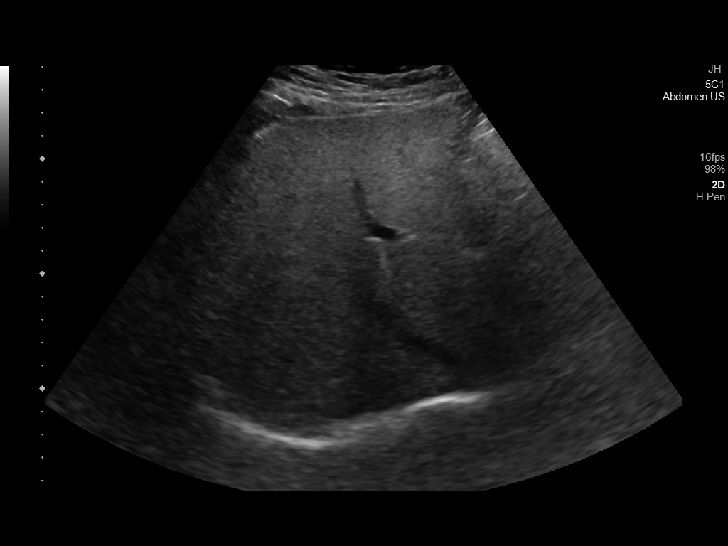
[im 67/179]
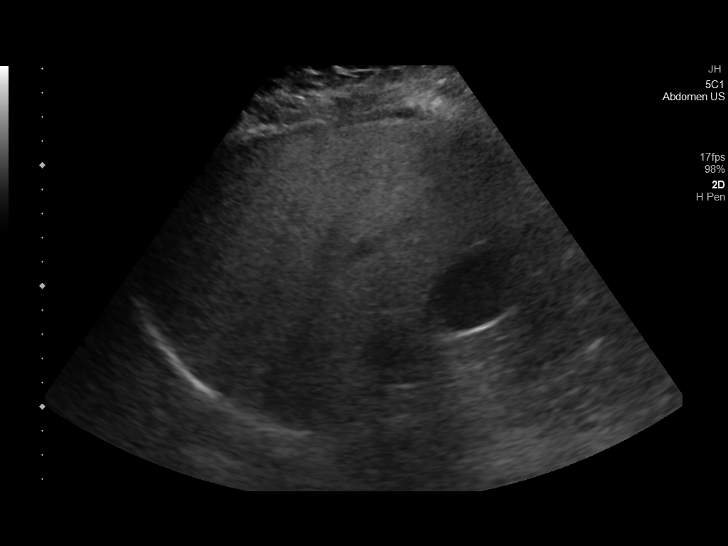
[im 82/179]
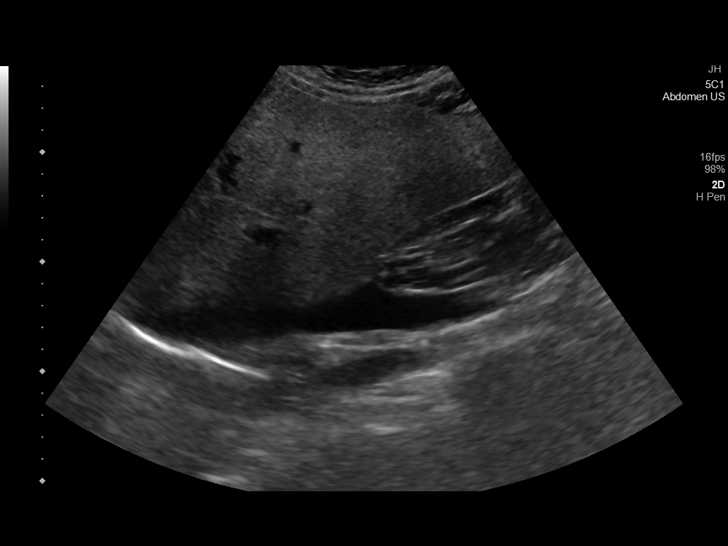
[im 97/179]
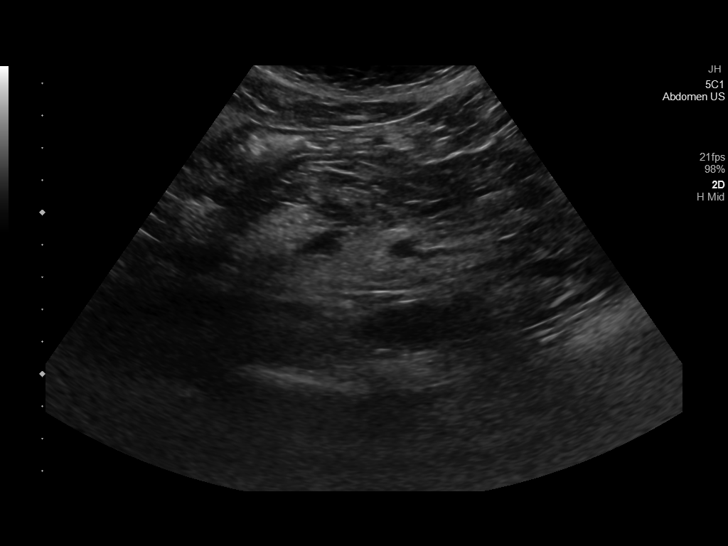
[im 112/179]
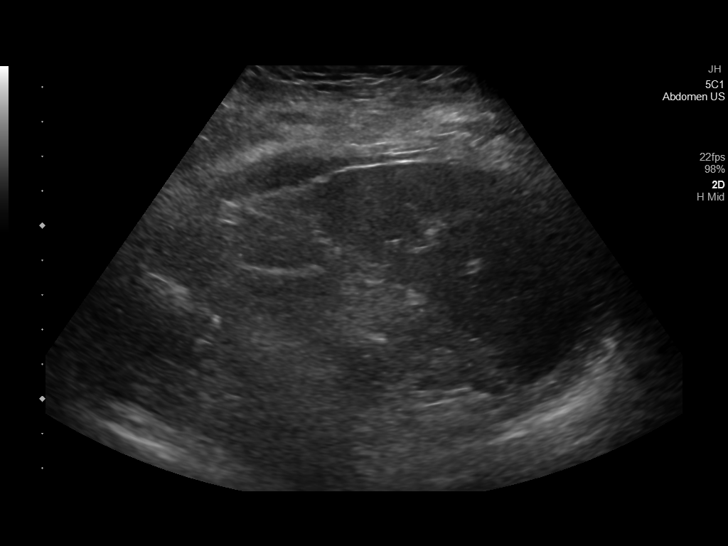
[im 119/179]
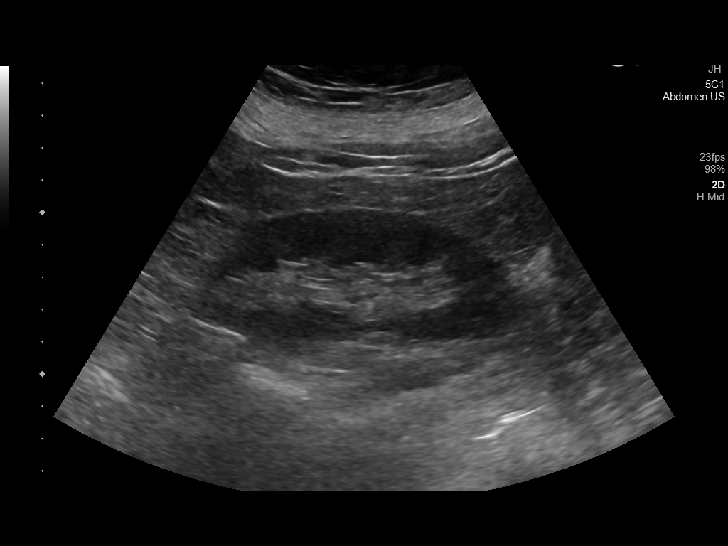
[im 134/179]
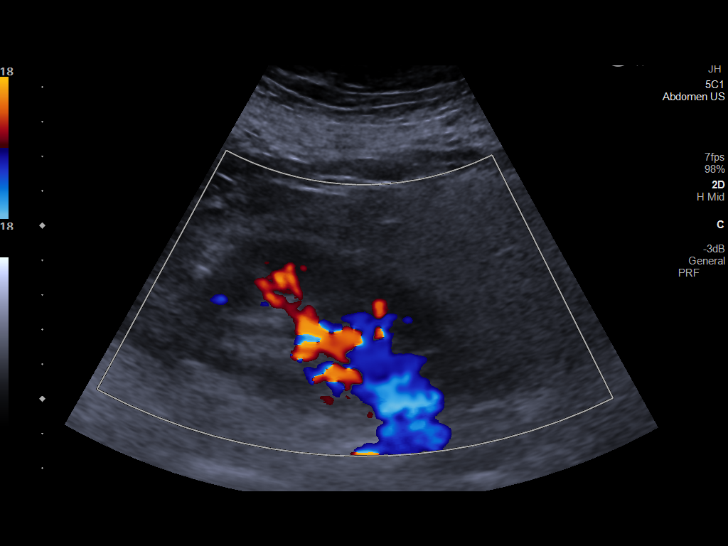
[im 149/179]
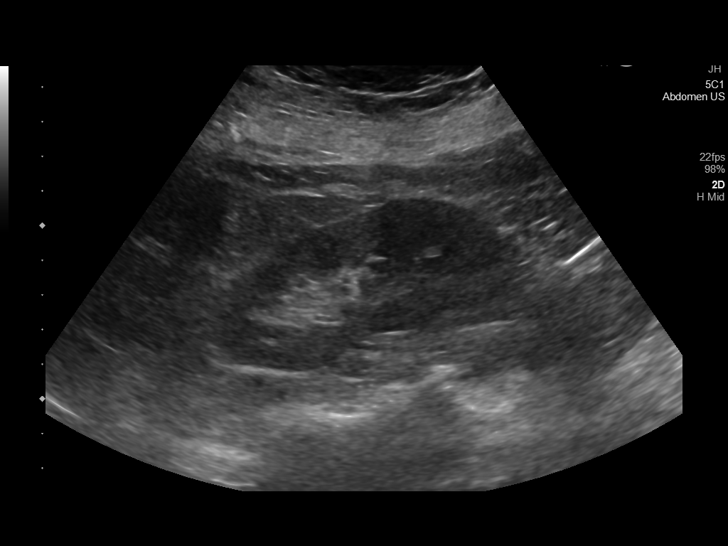
[im 164/179]
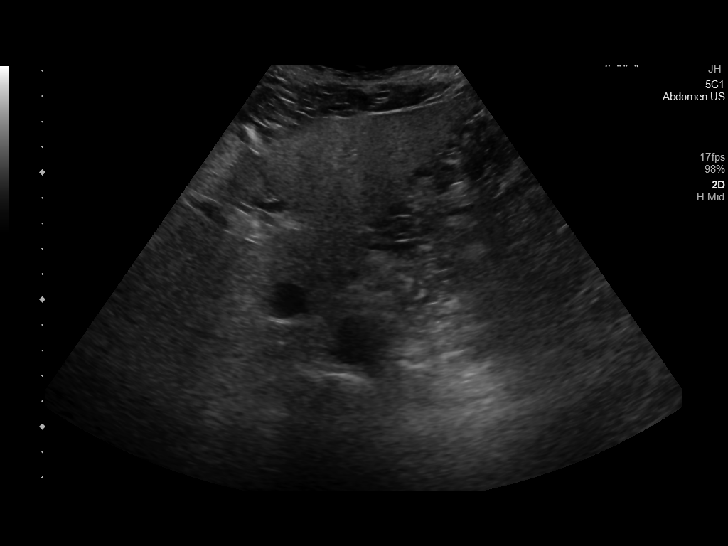
[im 179/179]
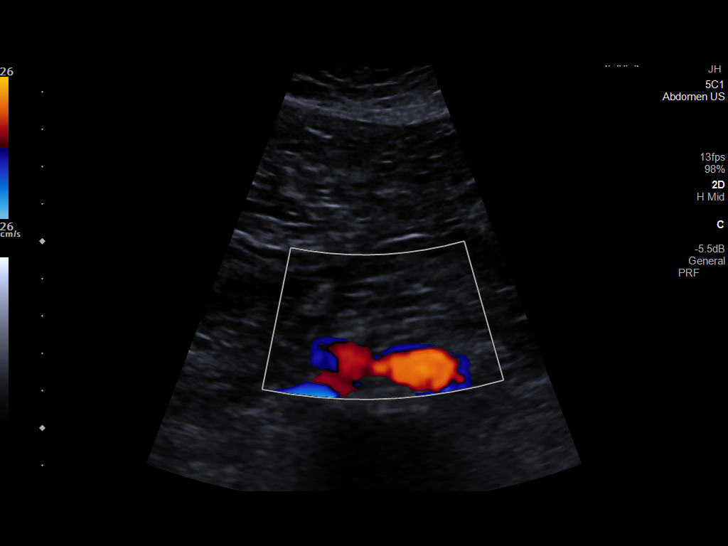

[14 of 25 positions shown; findings below may reference images not displayed]

FINDINGS: Gallbladder: No gallstones or wall thickening visualized. No
sonographic Murphy sign noted by sonographer.

Common bile duct: Diameter: 3 mm

Liver: No focal lesion identified. Diffusely increased parenchymal
echogenicity. Portal vein is patent on color Doppler imaging with
normal direction of blood flow towards the liver.

IVC: No abnormality visualized.

Pancreas: Visualized portion unremarkable.

Spleen: Size and appearance within normal limits.

Right Kidney: Length: 10.4 cm. Echogenicity within normal limits. No
mass or hydronephrosis visualized.

Left Kidney: Length: 10 8 cm. Echogenicity within normal limits. No
mass or hydronephrosis visualized.

Abdominal aorta: No aneurysm visualized.

Other findings: None.
IMPRESSION: Diffusely increased echogenicity of the hepatic parenchyma,
nonspecific but most commonly seen with hepatic steatosis.

## 2023-06-29 IMAGING — CT CT HEART MORP W/ CTA COR W/ SCORE W/ CA W/CM &/OR W/O CM
2 of 14 series · 4 of 20 positions shown, 5 images · non-contrast
Comparison: None.

Addendum:
CLINICAL DATA: Chest pain

EXAM:
Cardiac/Coronary  CTA
TECHNIQUE: The patient was scanned on a Siemens [employer] go.Top scanner.

[Series 4: multiphase % cta coronary 0.60 · axial · 0.32mm/px · z∈[-1069,-1030]mm · 2 of 3444 slices shown, 3 images]
[im 1148/3444  vessel]
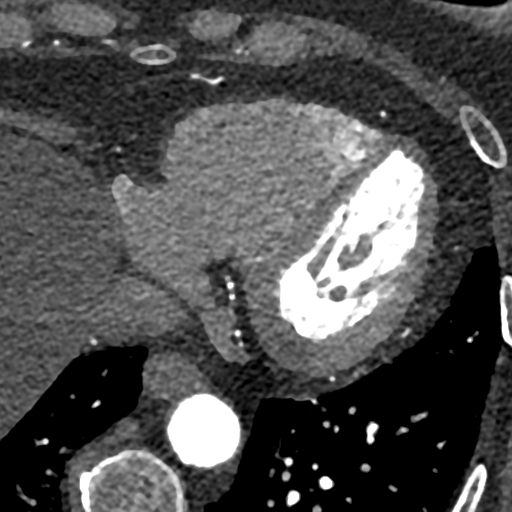
[im 1148/3444  lung]
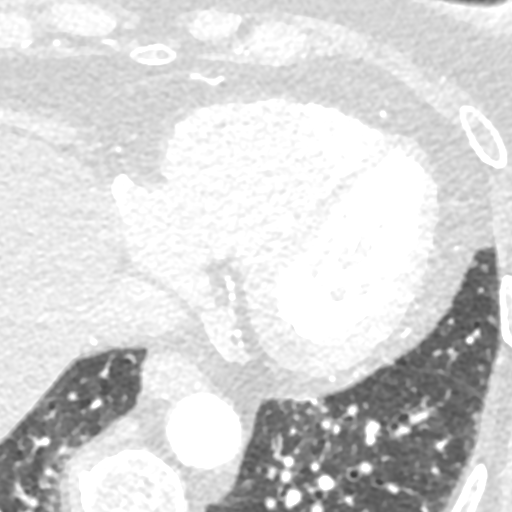
[im 2296/3444  vessel]
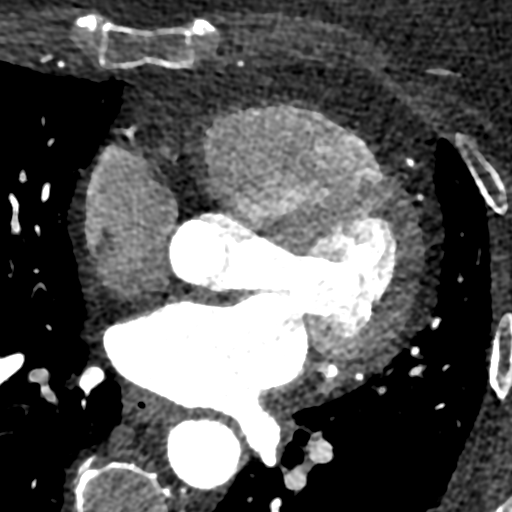

[Series 35: 2 multiphase % cta coronary 0.60 · axial · 0.32mm/px · z∈[-1069,-1030]mm · 2 of 3444 slices shown]
[im 1148/3444  vessel]
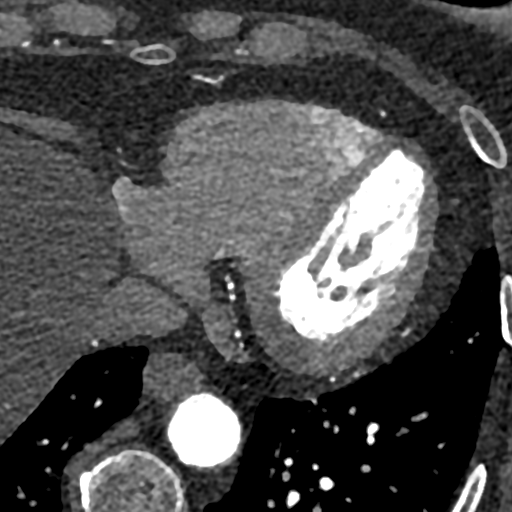
[im 2296/3444  vessel]
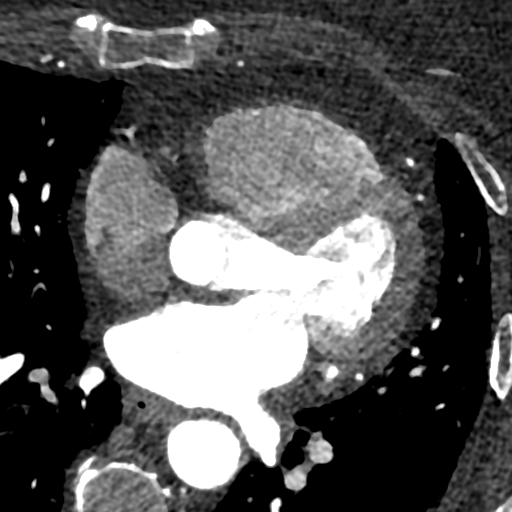

[4 of 20 positions shown; findings below may reference images not displayed]

:
A retrospective scan was triggered in the descending thoracic aorta.
Axial non-contrast 3 mm slices were carried out through the heart.
The data set was analyzed on a dedicated work station and scored
using the Agatson method. Gantry rotation speed was 330 msecs and
collimation was .6 mm. 100mg of metoprolol and 0.8 mg of sl NTG was
given. The 3D data set was reconstructed in 5% intervals of the
60-95 % of the R-R cycle. Diastolic phases were analyzed on a
dedicated work station using MPR, MIP and VRT modes. The patient
received 80 cc of contrast.
FINDINGS: Aorta: Normal size. Mild aortic root and descending aorta
calcifications. No dissection.

Aortic Valve:  Trileaflet.  No calcifications.

Coronary Arteries:  Normal coronary origin.  Right dominance.

RCA is a small non-dominant artery.  There is no plaque.

Left main is a large artery that gives rise to LAD and LCX arteries.

LAD has calcified and non calcified plaque in the proximal segment
causing mild to moderate stenosis (40-50%).

LCX is a dominant artery that gives rise to one two obtuse marginal
branches before branching to PDA and PL branches. There is no
plaque.

Other findings:

Normal pulmonary vein drainage into the left atrium.

Normal left atrial appendage without a thrombus.

Normal size of the pulmonary artery.
IMPRESSION: 1. Coronary calcium score of 86.7. This was 72nd percentile for age
and sex matched control.

2. Normal coronary origin with left dominance.

3. mild to moderate proximal LAD stenosis (40-50%).

4. Consider non-atherosclerotic causes of chest pain. Consider
preventive therapy and risk factor modification.

5. Additional analysis with CT FFR will be submitted and reported
separately.

ADDENDUM:
The following report is an over-read performed by radiologist Dr.
over-read does not include interpretation of cardiac or coronary
anatomy or pathology. The coronary calcium score/coronary CTA
interpretation by the cardiologist is attached.
FINDINGS: Vascular: Aortic atherosclerosis without aneurysmal dilation. Normal
caliber central pulmonary arteries.

Mediastinum/Nodes: No pathologically enlarged mediastinal or hilar
lymph nodes. Calcified right hilar lymph nodes. Small hiatal hernia.

Lungs/Pleura: Hypoventilatory change in the dependent lungs. Mild
diffuse bronchial wall thickening. No suspicious pulmonary nodules
or masses. No pleural effusion or pneumothorax.

Upper Abdomen: Hepatic steatosis.

Musculoskeletal: Multilevel degenerative changes spine.
IMPRESSION: 1. Mild diffuse bronchial wall thickening, which may reflect
bronchitis.
2. Hepatic steatosis.
3. Small hiatal hernia.
4.  Aortic Atherosclerosis ([id number]).

*** End of Addendum ***
:
A retrospective scan was triggered in the descending thoracic aorta.
Axial non-contrast 3 mm slices were carried out through the heart.
The data set was analyzed on a dedicated work station and scored
using the Agatson method. Gantry rotation speed was 330 msecs and
collimation was .6 mm. 100mg of metoprolol and 0.8 mg of sl NTG was
given. The 3D data set was reconstructed in 5% intervals of the
60-95 % of the R-R cycle. Diastolic phases were analyzed on a
dedicated work station using MPR, MIP and VRT modes. The patient
received 80 cc of contrast.
FINDINGS: Aorta: Normal size. Mild aortic root and descending aorta
calcifications. No dissection.

Aortic Valve:  Trileaflet.  No calcifications.

Coronary Arteries:  Normal coronary origin.  Right dominance.

RCA is a small non-dominant artery.  There is no plaque.

Left main is a large artery that gives rise to LAD and LCX arteries.

LAD has calcified and non calcified plaque in the proximal segment
causing mild to moderate stenosis (40-50%).

LCX is a dominant artery that gives rise to one two obtuse marginal
branches before branching to PDA and PL branches. There is no
plaque.

Other findings:

Normal pulmonary vein drainage into the left atrium.

Normal left atrial appendage without a thrombus.

Normal size of the pulmonary artery.
IMPRESSION: 1. Coronary calcium score of 86.7. This was 72nd percentile for age
and sex matched control.

2. Normal coronary origin with left dominance.

3. mild to moderate proximal LAD stenosis (40-50%).

4. Consider non-atherosclerotic causes of chest pain. Consider
preventive therapy and risk factor modification.

5. Additional analysis with CT FFR will be submitted and reported
separately.
